# Patient Record
Sex: Female | Born: 1980 | Race: Black or African American | Hispanic: No | Marital: Single | Smoking: Never smoker
Health system: Southern US, Community
[De-identification: ages and names within clinical notes are randomized; demographics above are authoritative.]

## PROBLEM LIST (undated history)

## (undated) HISTORY — PX: ABDOMINAL HYSTERECTOMY: SHX81

---

## 2008-06-29 ENCOUNTER — Ambulatory Visit: Payer: Self-pay | Admitting: Family Medicine

## 2013-05-28 ENCOUNTER — Emergency Department: Payer: Self-pay | Admitting: Emergency Medicine

## 2013-05-28 LAB — BASIC METABOLIC PANEL
Anion Gap: 4 — ABNORMAL LOW (ref 7–16)
BUN: 8 mg/dL (ref 7–18)
Calcium, Total: 8.7 mg/dL (ref 8.5–10.1)
Chloride: 107 mmol/L (ref 98–107)
Co2: 29 mmol/L (ref 21–32)
Creatinine: 0.8 mg/dL (ref 0.60–1.30)
EGFR (African American): >60
EGFR (Non-African Amer.): >60
Glucose: 106 mg/dL — ABNORMAL HIGH (ref 65–99)
Osmolality: 278 (ref 275–301)
Potassium: 3.6 mmol/L (ref 3.5–5.1)
Sodium: 140 mmol/L (ref 136–145)

## 2013-05-28 LAB — CBC
HCT: 34.5 % — ABNORMAL LOW (ref 35.0–47.0)
HGB: 12 g/dL (ref 12.0–16.0)
MCH: 29.8 pg (ref 26.0–34.0)
Platelet: 268 10*3/uL (ref 150–440)
RDW: 13.2 % (ref 11.5–14.5)
WBC: 6.5 10*3/uL (ref 3.6–11.0)

## 2013-12-09 ENCOUNTER — Emergency Department: Payer: Self-pay | Admitting: Emergency Medicine

## 2014-09-17 IMAGING — CR RIGHT ANKLE - COMPLETE 3+ VIEW
1 series · 3 of 3 positions shown · non-contrast
Comparison: None.

CLINICAL DATA: Right ankle pain.  Fell off step.  Swelling.

EXAM:
RIGHT ANKLE - COMPLETE 3+ VIEW

[Series 1: x ankle ap right · 0.14mm/px · 3 of 3 slices shown]
[im 1/3]
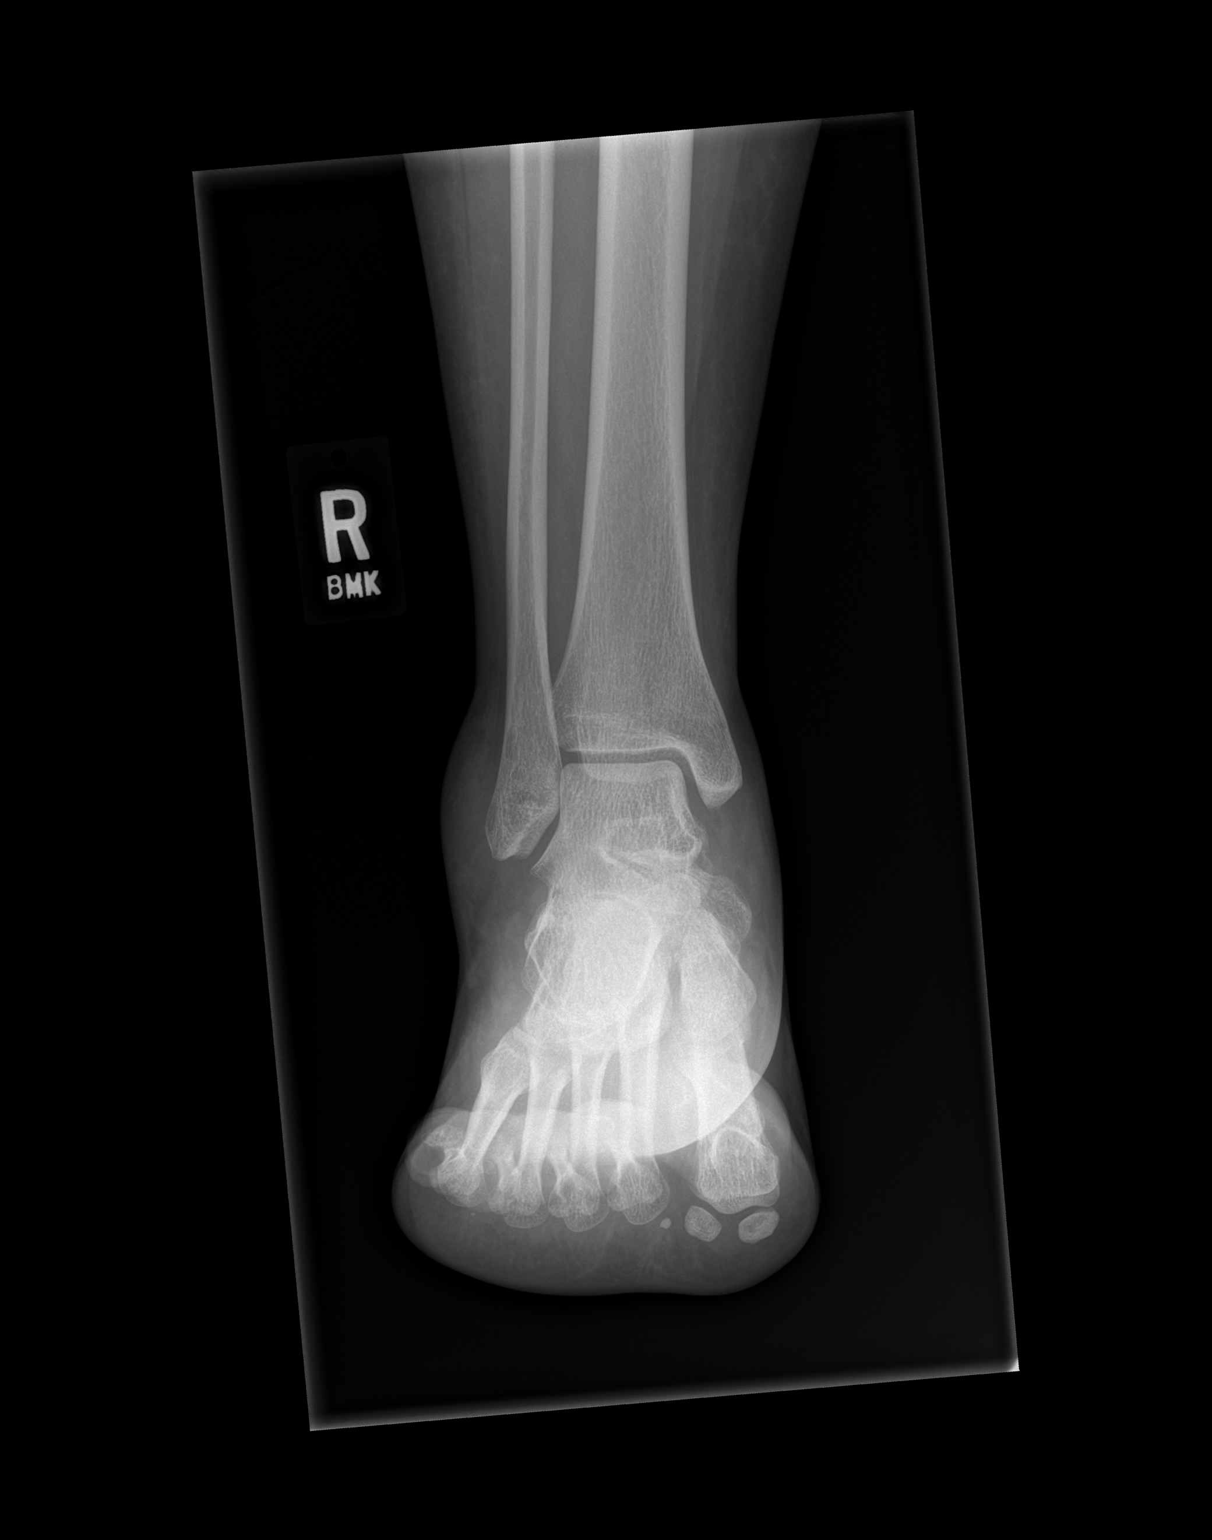
[im 2/3]
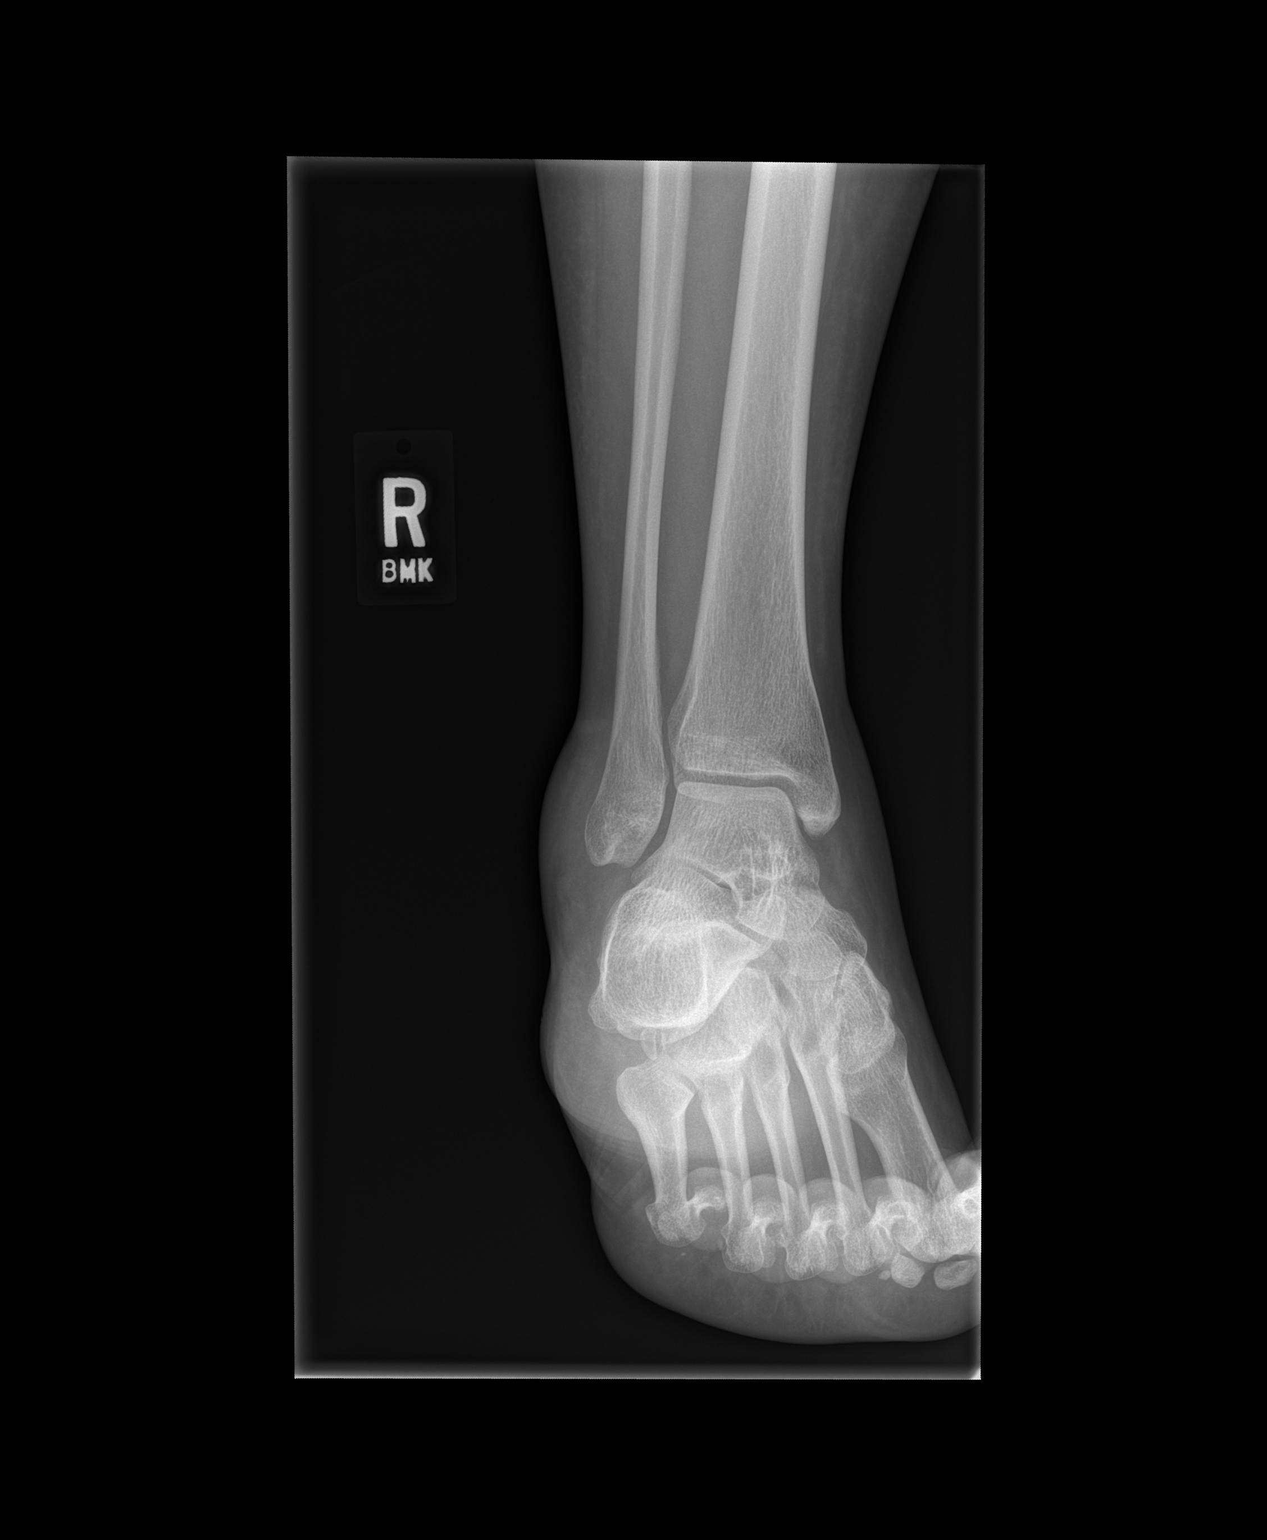
[im 3/3]
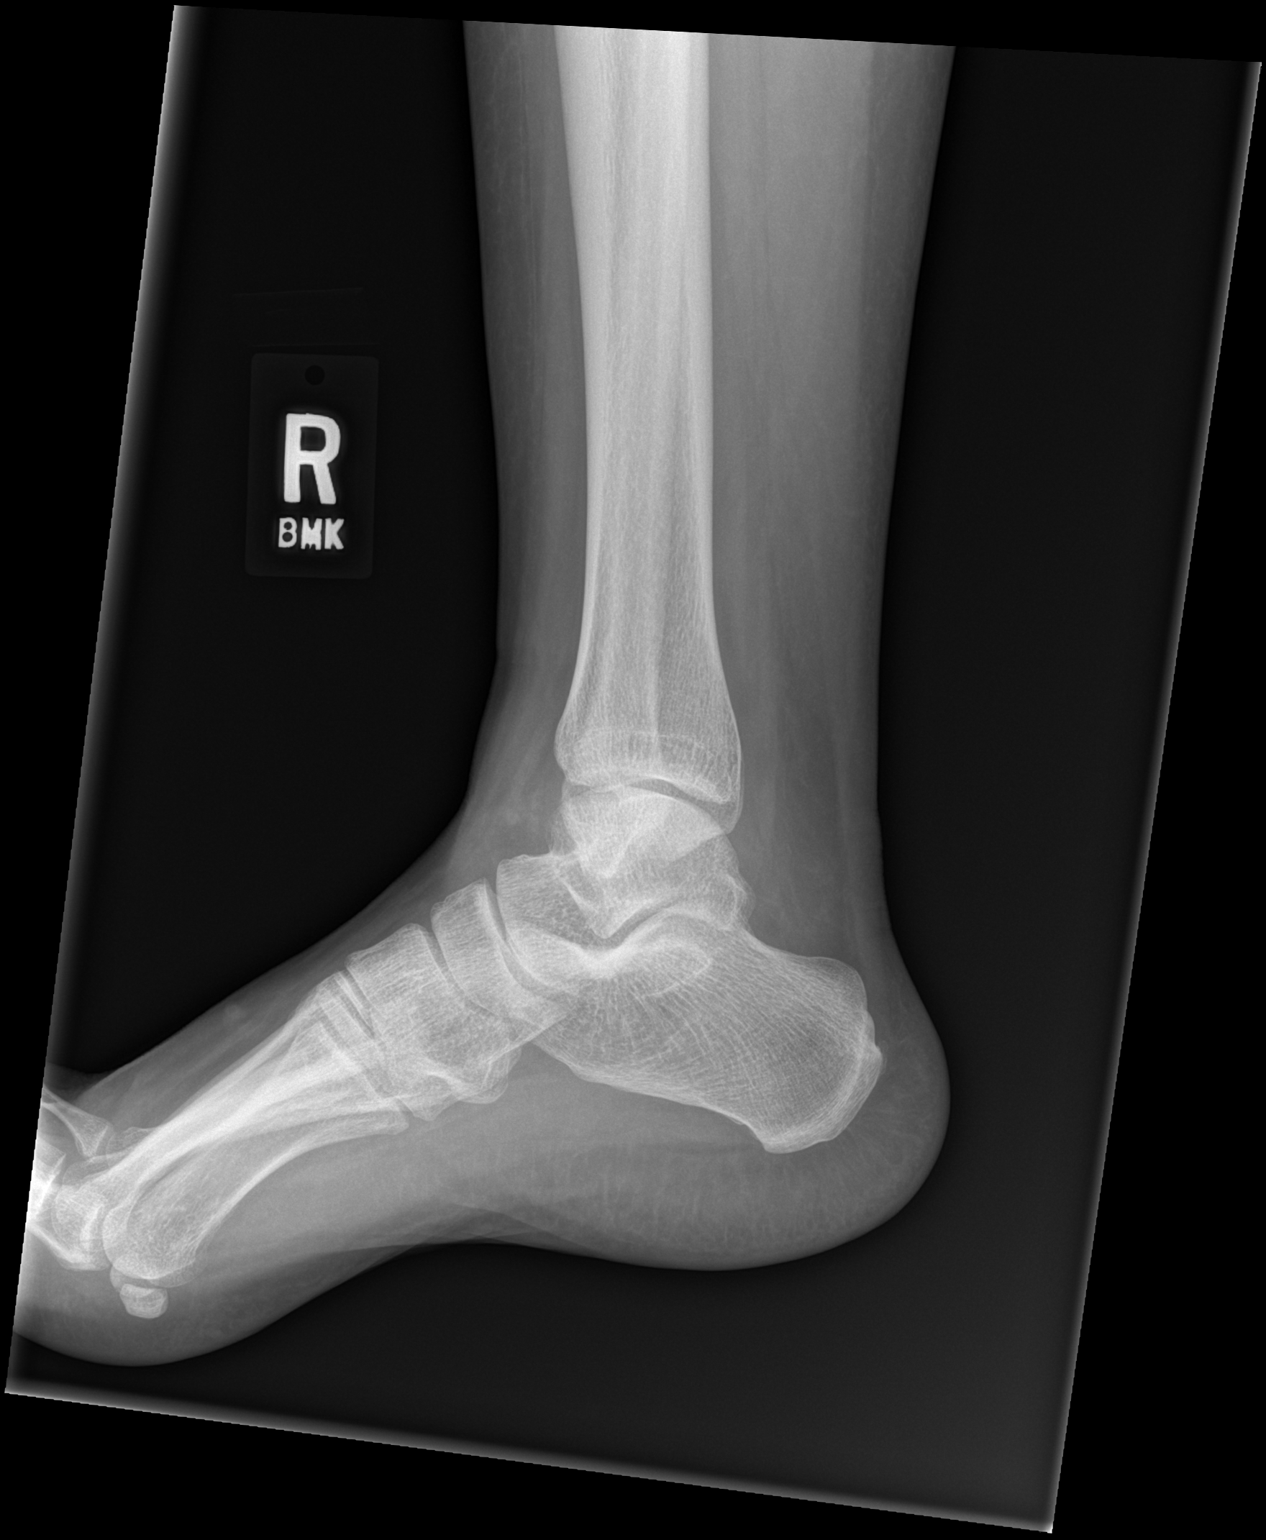

[3 of 3 positions shown; findings below may reference images not displayed]

FINDINGS: There is significant lateral soft tissue swelling. No evidence for
acute fracture or subluxation. No radiopaque foreign body or soft
tissue gas.
IMPRESSION: Significant lateral soft tissue swelling.

## 2023-01-01 ENCOUNTER — Inpatient Hospital Stay (HOSPITAL_COMMUNITY): Payer: BLUE CROSS/BLUE SHIELD

## 2023-01-01 ENCOUNTER — Encounter: Payer: Self-pay | Admitting: Emergency Medicine

## 2023-01-01 ENCOUNTER — Inpatient Hospital Stay (HOSPITAL_COMMUNITY)
Admission: EM | Admit: 2023-01-01 | Discharge: 2023-01-03 | DRG: 064 | Disposition: A | Discharge status: Home / Self Care | Payer: BLUE CROSS/BLUE SHIELD | Source: Other Acute Inpatient Hospital | Attending: Neurology | Admitting: Neurology

## 2023-01-01 ENCOUNTER — Emergency Department: Payer: BLUE CROSS/BLUE SHIELD

## 2023-01-01 ENCOUNTER — Encounter (HOSPITAL_COMMUNITY): Payer: Self-pay

## 2023-01-01 ENCOUNTER — Other Ambulatory Visit: Payer: Self-pay

## 2023-01-01 ENCOUNTER — Emergency Department
Admission: EM | Admit: 2023-01-01 | Discharge: 2023-01-01 | Disposition: A | Discharge status: Other Acute Inpatient Hospital | Payer: BLUE CROSS/BLUE SHIELD | Attending: Emergency Medicine | Admitting: Emergency Medicine

## 2023-01-01 DIAGNOSIS — R569 Unspecified convulsions: Secondary | ICD-10-CM

## 2023-01-01 DIAGNOSIS — I611 Nontraumatic intracerebral hemorrhage in hemisphere, cortical: Principal | ICD-10-CM | POA: Diagnosis present

## 2023-01-01 DIAGNOSIS — G936 Cerebral edema: Secondary | ICD-10-CM | POA: Diagnosis present

## 2023-01-01 DIAGNOSIS — I619 Nontraumatic intracerebral hemorrhage, unspecified: Secondary | ICD-10-CM | POA: Diagnosis present

## 2023-01-01 DIAGNOSIS — I6389 Other cerebral infarction: Secondary | ICD-10-CM | POA: Diagnosis not present

## 2023-01-01 DIAGNOSIS — R519 Headache, unspecified: Secondary | ICD-10-CM | POA: Diagnosis present

## 2023-01-01 DIAGNOSIS — I10 Essential (primary) hypertension: Secondary | ICD-10-CM | POA: Diagnosis present

## 2023-01-01 DIAGNOSIS — I161 Hypertensive emergency: Secondary | ICD-10-CM

## 2023-01-01 DIAGNOSIS — E785 Hyperlipidemia, unspecified: Secondary | ICD-10-CM | POA: Diagnosis present

## 2023-01-01 DIAGNOSIS — G935 Compression of brain: Secondary | ICD-10-CM | POA: Diagnosis present

## 2023-01-01 LAB — CBC WITH DIFFERENTIAL/PLATELET
Abs Immature Granulocytes: 0.07 10*3/uL (ref 0.00–0.07)
Basophils Absolute: 0 10*3/uL (ref 0.0–0.1)
Basophils Relative: 0 %
Eosinophils Absolute: 0 10*3/uL (ref 0.0–0.5)
Eosinophils Relative: 0 %
HCT: 39.4 % (ref 36.0–46.0)
Hemoglobin: 13.1 g/dL (ref 12.0–15.0)
Immature Granulocytes: 1 %
Lymphocytes Relative: 7 %
Lymphs Abs: 0.9 10*3/uL (ref 0.7–4.0)
MCH: 27.8 pg (ref 26.0–34.0)
MCHC: 33.2 g/dL (ref 30.0–36.0)
MCV: 83.5 fL (ref 80.0–100.0)
Monocytes Absolute: 0.5 10*3/uL (ref 0.1–1.0)
Monocytes Relative: 4 %
Neutro Abs: 10.7 10*3/uL — ABNORMAL HIGH (ref 1.7–7.7)
Neutrophils Relative %: 88 %
Platelets: 381 10*3/uL (ref 150–400)
RBC: 4.72 MIL/uL (ref 3.87–5.11)
RDW: 13.1 % (ref 11.5–15.5)
WBC: 12.1 10*3/uL — ABNORMAL HIGH (ref 4.0–10.5)
nRBC: 0 % (ref 0.0–0.2)

## 2023-01-01 LAB — URINE DRUG SCREEN, QUALITATIVE (ARMC ONLY)
Amphetamines, Ur Screen: NOT DETECTED
Barbiturates, Ur Screen: NOT DETECTED
Benzodiazepine, Ur Scrn: NOT DETECTED
Cannabinoid 50 Ng, Ur ~~LOC~~: NOT DETECTED
Cocaine Metabolite,Ur ~~LOC~~: NOT DETECTED
MDMA (Ecstasy)Ur Screen: NOT DETECTED
Methadone Scn, Ur: NOT DETECTED
Opiate, Ur Screen: NOT DETECTED
Phencyclidine (PCP) Ur S: NOT DETECTED
Tricyclic, Ur Screen: NOT DETECTED

## 2023-01-01 LAB — PROTIME-INR
INR: 1 (ref 0.8–1.2)
Prothrombin Time: 12.8 seconds (ref 11.4–15.2)

## 2023-01-01 LAB — POC URINE PREG, ED: Preg Test, Ur: NEGATIVE

## 2023-01-01 LAB — URINALYSIS, ROUTINE W REFLEX MICROSCOPIC
Bilirubin Urine: NEGATIVE
Glucose, UA: 50 mg/dL — AB
Hgb urine dipstick: NEGATIVE
Ketones, ur: NEGATIVE mg/dL
Leukocytes,Ua: NEGATIVE
Nitrite: NEGATIVE
Protein, ur: 100 mg/dL — AB
Specific Gravity, Urine: 1.015 (ref 1.005–1.030)
pH: 6 (ref 5.0–8.0)

## 2023-01-01 LAB — COMPREHENSIVE METABOLIC PANEL WITH GFR
ALT: 23 U/L (ref 0–44)
AST: 28 U/L (ref 15–41)
Albumin: 4.1 g/dL (ref 3.5–5.0)
Alkaline Phosphatase: 106 U/L (ref 38–126)
Anion gap: 9 (ref 5–15)
BUN: 10 mg/dL (ref 6–20)
CO2: 23 mmol/L (ref 22–32)
Calcium: 8.7 mg/dL — ABNORMAL LOW (ref 8.9–10.3)
Chloride: 103 mmol/L (ref 98–111)
Creatinine, Ser: 0.8 mg/dL (ref 0.44–1.00)
GFR, Estimated: >60 mL/min
Glucose, Bld: 140 mg/dL — ABNORMAL HIGH (ref 70–99)
Potassium: 3.2 mmol/L — ABNORMAL LOW (ref 3.5–5.1)
Sodium: 135 mmol/L (ref 135–145)
Total Bilirubin: 0.7 mg/dL (ref 0.3–1.2)
Total Protein: 8 g/dL (ref 6.5–8.1)

## 2023-01-01 LAB — APTT: aPTT: 28 s (ref 24–36)

## 2023-01-01 MED ORDER — ACETAMINOPHEN 650 MG RE SUPP: 650.0000 mg | RECTAL | Status: DC | PRN

## 2023-01-01 MED ORDER — CLEVIDIPINE BUTYRATE 0.5 MG/ML IV EMUL
0.0000 mg/h | INTRAVENOUS | Status: DC
  Administered 2023-01-01: 9 mg/h via INTRAVENOUS
  Filled 2023-01-01: qty 100

## 2023-01-01 MED ORDER — IOHEXOL 350 MG/ML SOLN
75.0000 mL | Freq: Once | INTRAVENOUS | Status: AC | PRN
  Administered 2023-01-01: 75 mL via INTRAVENOUS

## 2023-01-01 MED ORDER — LABETALOL HCL 5 MG/ML IV SOLN: 20.0000 mg | Freq: Once | INTRAVENOUS | Status: DC

## 2023-01-01 MED ORDER — SENNOSIDES-DOCUSATE SODIUM 8.6-50 MG PO TABS
1.0000 | ORAL_TABLET | Freq: Two times a day (BID) | ORAL | Status: DC
  Administered 2023-01-02: 1 via ORAL
  Filled 2023-01-01 (×2): qty 1

## 2023-01-01 MED ORDER — MAGIC MOUTHWASH W/LIDOCAINE
5.0000 mL | Freq: Four times a day (QID) | ORAL | Status: DC | PRN
  Administered 2023-01-02 – 2023-01-03 (×3): 5 mL via ORAL
  Filled 2023-01-01 (×5): qty 5

## 2023-01-01 MED ORDER — PANTOPRAZOLE SODIUM 40 MG IV SOLR
40.0000 mg | Freq: Every day | INTRAVENOUS | Status: DC
  Administered 2023-01-01: 40 mg via INTRAVENOUS
  Filled 2023-01-01: qty 10

## 2023-01-01 MED ORDER — CLEVIDIPINE BUTYRATE 0.5 MG/ML IV EMUL
0.0000 mg/h | INTRAVENOUS | Status: DC
  Administered 2023-01-01: 2 mg/h via INTRAVENOUS
  Filled 2023-01-01 (×2): qty 50

## 2023-01-01 MED ORDER — LEVETIRACETAM IN NACL 1000 MG/100ML IV SOLN
1000.0000 mg | Freq: Once | INTRAVENOUS | Status: AC
  Administered 2023-01-01: 1000 mg via INTRAVENOUS
  Filled 2023-01-01: qty 100

## 2023-01-01 MED ORDER — ACETAMINOPHEN 325 MG PO TABS
650.0000 mg | ORAL_TABLET | ORAL | Status: DC | PRN
  Administered 2023-01-02 – 2023-01-03 (×2): 650 mg via ORAL
  Filled 2023-01-01 (×2): qty 2

## 2023-01-01 MED ORDER — LEVETIRACETAM IN NACL 1000 MG/100ML IV SOLN
1000.0000 mg | Freq: Once | INTRAVENOUS | Status: AC
  Administered 2023-01-01: 1000 mg via INTRAVENOUS
  Filled 2023-01-01 (×2): qty 100

## 2023-01-01 MED ORDER — STROKE: EARLY STAGES OF RECOVERY BOOK
Freq: Once | Status: AC
  Administered 2023-01-02: 1
  Filled 2023-01-01: qty 1

## 2023-01-01 MED ORDER — LEVETIRACETAM 500 MG PO TABS
500.0000 mg | ORAL_TABLET | Freq: Two times a day (BID) | ORAL | Status: DC
  Administered 2023-01-01 – 2023-01-03 (×4): 500 mg via ORAL
  Filled 2023-01-01 (×4): qty 1

## 2023-01-01 MED ORDER — PHENOL 1.4 % MT LIQD: 1.0000 | OROMUCOSAL | Status: DC | PRN

## 2023-01-01 MED ORDER — ACETAMINOPHEN 160 MG/5ML PO SOLN: 650.0000 mg | ORAL | Status: DC | PRN

## 2023-01-01 NOTE — ED Notes (Signed)
EMTALA reviewed by this RN, transfer consent signed.  

## 2023-01-01 NOTE — Progress Notes (Signed)
Neurology telephone note  I was contacted by Dr. Starleen Blue [facility] ED by phone about this 41 yo patient who presented after first-time GTC seizure and was found to have R frontoparietal hyperdensity on head CT concerning for hemorrhage. She is not on anticoagulation. She is hypertensive to the 725D systolic. She is currently lethargic but waking up and is oriented x3 without focal deficits. There are no beds currently available on 4N.  - ED to ED transfer to [facility]. Patient will be admitted from there to 4N by Dr. Annice Pih - Clevidipine for goal SBP <150 - SCDs for DVT prophylaxis - Head CT in 6 hrs assess stability of ICH - CTA head now r/o vascular malformation - MRI brain wwo for further characterization - S/p keppra 2000mg  load, f/b 500mg  bid - HOB elevated 30 degrees  D/w Dr. Lorrin Goodell and Dr. Starleen Blue. Please page me for any questions prior to 8pm, and page Sal Khal[facility] overnight neurohospitalist for any questions after 8pm.  Su Monks, MD [facility] [phone]  If 7pm- 7am, please page neurology on call as listed in [place].

## 2023-01-01 NOTE — ED Notes (Signed)
Pt to ED via [place] from work for seizure. Per EMS when they arrived pt was post-ictal. Pt has trauma to tongue and was incontinent of urine. EMS states that pt does not have hx/o seizures. EMS reports that pts initial HR was 130 but is now 92 and sinus. Pt CBG 208, no hx/o DM.

## 2023-01-01 NOTE — ED Provider Notes (Signed)
[facility] Provider Note    Event Date/Time   First MD Initiated Contact with Patient 01/01/23 1811     (approximate)   History   No chief complaint on file.   HPI  Kimberly Osborn is a 42 y.o. female with no significant past medical history who presents after a seizure.  Patient is accompanied by family who were not there for the event but were told that she was at work standing up when she suddenly stopped working and had what looked like seizure activity.  Unclear if she fell to the ground or not.  Patient does not remember the episode.  She did urinate on herself.  Also bit her tongue.  Patient has been having some headaches.  Denies vision change numbness tingling or weakness.  Denies drug or alcohol use.  No prior history of hypertension.     No past medical history on file.  There are no problems to display for this patient.    Physical Exam  Triage Vital Signs: ED Triage Vitals  Enc Vitals Group     BP 01/01/23 1743 (!) 155/107     Pulse Rate 01/01/23 1743 (!) 113     Resp 01/01/23 1743 16     Temp 01/01/23 1743 98.4 F (36.9 C)     Temp Source 01/01/23 1743 Oral     SpO2 01/01/23 1819 100 %     Weight 01/01/23 1739 170 lb (77.1 kg)     Height 01/01/23 1739 5\' 3"  (1.6 m)     Head Circumference --      Peak Flow --      Pain Score 01/01/23 1739 0     Pain Loc --      Pain Edu? --      Excl. in [place]? --     Most recent vital signs: Vitals:   01/01/23 1920 01/01/23 2001  BP: (!) 143/77 (!) 154/90  Pulse: (!) 120 (!) 125  Resp: 14 19  Temp:  98.4 F (36.9 C)  SpO2: 98% 98%     General: Awake, no distress.  CV:  Good peripheral perfusion.  Resp:  Normal effort.  Abd:  No distention.  Neuro:             Awake, Alert, Oriented x 3 patient is somewhat slow to respond but is not dysarthric or aphasic Other:  Aox3, nml speech  PERRL, EOMI, face symmetric, nml tongue movement  5/5 strength in the BL upper and lower extremities   Sensation grossly intact in the BL upper and lower extremities  Finger-nose-finger intact BL  Small abrasion on the right chin Lateral tongue laceration   ED Results / Procedures / Treatments  Labs (all labs ordered are listed, but only abnormal results are displayed) Labs Reviewed  URINALYSIS, ROUTINE W REFLEX MICROSCOPIC - Abnormal; Notable for the following components:      Result Value   Color, Urine YELLOW (*)    APPearance HAZY (*)    Glucose, UA 50 (*)    Protein, ur 100 (*)    Bacteria, UA RARE (*)    All other components within normal limits  CBC WITH DIFFERENTIAL/PLATELET - Abnormal; Notable for the following components:   WBC 12.1 (*)    Neutro Abs 10.7 (*)    All other components within normal limits  COMPREHENSIVE METABOLIC PANEL - Abnormal; Notable for the following components:   Potassium 3.2 (*)    Glucose, Bld 140 (*)  Calcium 8.7 (*)    All other components within normal limits  PROTIME-INR  APTT  URINE DRUG SCREEN, QUALITATIVE ([facility] ONLY)  POC URINE PREG, ED     EKG     RADIOLOGY I reviewed interpreted the CT Noncon scan which shows intraparenchymal hemorrhage   PROCEDURES:  Critical Care performed: Yes, see critical care procedure note(s)  Procedures  The patient is on the cardiac monitor to evaluate for evidence of arrhythmia and/or significant heart rate changes.   MEDICATIONS ORDERED IN ED: Medications  clevidipine (CLEVIPREX) infusion 0.5 mg/mL (16 mg/hr Intravenous Rate/Dose Change 01/01/23 1857)  levETIRAcetam (KEPPRA) IVPB 1000 mg/100 mL premix (has no administration in time range)  levETIRAcetam (KEPPRA) IVPB 1000 mg/100 mL premix (0 mg Intravenous Stopped 01/01/23 1911)  iohexol (OMNIPAQUE) 350 MG/ML injection 75 mL (75 mLs Intravenous Contrast Given 01/01/23 1948)     IMPRESSION / MDM / ASSESSMENT AND PLAN / ED COURSE  I reviewed the triage vital signs and the nursing notes.                              Patient's  presentation is most consistent with acute presentation with potential threat to life or bodily function.  Differential diagnosis includes, but is not limited to, subarachnoid hemorrhage, intraparenchymal hemorrhage, new onset epilepsy, hypertensive emergency  Patient is a 42 year old female with no prior medical history presents after a seizure.  This was witnessed at work but somewhat unclear exactly what happened.  Her family members at bedside said that he was told that she was standing up when she started to look abnormal and then had seizure-like activity.  Patient did urinate on herself and bite her tongue.  She is not sure if she hit her head does have a small abrasion on the chin.  On exam patient is somewhat slow to respond but otherwise her neurologic exam is nonfocal.  CT head obtained from triage shows intraparenchymal hemorrhage in the frontoparietal region on the right with some associated mass effect but no midline shift.  Discussed with Dr. Cari Caraway and Dr. Quinn Axe with neurology.  I have started clevidipine drip with blood pressure goal less than 150 as well as load with Keppra.  Dr. Quinn Axe with neurology has spoken to [facility] neurology and she will be transferred.  Patient is agreeable to transfer.  Will obtain a CTA prior to transport.       FINAL CLINICAL IMPRESSION(S) / ED DIAGNOSES   Final diagnoses:  Intraparenchymal hemorrhage of brain ([place])     Rx / DC Orders   ED Discharge Orders     None        Note:  This document was prepared using Dragon voice recognition software and may include unintentional dictation errors.   Rada Hay, MD 01/01/23 2011

## 2023-01-01 NOTE — ED Notes (Signed)
Called [facility] transfer Ander Purpura) for ED to ED transfer per Starleen Blue, MD

## 2023-01-01 NOTE — ED Triage Notes (Signed)
Pt arrives as a transfer from [facility] with c/o seizure like activity. Pt does not have hx of seizures. Pt a&ox4.

## 2023-01-01 NOTE — ED Triage Notes (Signed)
Reprots a seizure at work today.  Denies previous history of seizure.  Currently AAOx3.  Skin warm and dry. NAD.States "My legs feel a little funny"

## 2023-01-01 NOTE — H&P (Signed)
NEUROLOGY CONSULTATION NOTE   Date of service: January 01, 2023 Patient Name: Kimberly Osborn MRN:  [id number] DOB:  [birth year]  _ _ _   _ __   _ __ _ _  __ __   _ __   __ _  History of Present Illness  Kimberly Osborn is a 42 y.o. female with no significant medical hx but also has not been to a doctor in several years who presents with seizure at work.  Patient reports that she works as a Conservation officer, nature and was working on some paperwork at her job when she was noted by coworkers and customers to have a seizure.  She has no recollection of the actual event.  She urinated on herself and bit her tongue.  No prior history of seizures.  Does not drink alcohol.  No recent head trauma.  No family history of epilepsy/seizures.  Recently took some marijuana but denies any other substance use.  She was brought in to [facility] emergency department where she had imaging with CT head which was notable for a right frontal parietal hemorrhage.   LKW: 1600 mRS: 0 tNKASE: Not offered, she has ICH. Thrombectomy: Not offered, she has ICH. ICH score: 0. NIHSS components Score: Comment  1a Level of Conscious 0[x]  1[]  2[]  3[]      1b LOC Questions 0[x]  1[]  2[]       1c LOC Commands 0[x]  1[]  2[]       2 Best Gaze 0[x]  1[]  2[]       3 Visual 0[x]  1[]  2[]  3[]      4 Facial Palsy 0[x]  1[]  2[]  3[]      5a Motor Arm - left 0[x]  1[]  2[]  3[]  4[]  UN[]    5b Motor Arm - Right 0[x]  1[]  2[]  3[]  4[]  UN[]    6a Motor Leg - Left 0[x]  1[]  2[]  3[]  4[]  UN[]    6b Motor Leg - Right 0[x]  1[]  2[]  3[]  4[]  UN[]    7 Limb Ataxia 0[x]  1[]  2[]  3[]  UN[]     8 Sensory 0[x]  1[]  2[]  UN[]      9 Best Language 0[x]  1[]  2[]  3[]      10 Dysarthria 0[x]  1[]  2[]  UN[]      11 Extinct. and Inattention 0[x]  1[]  2[]       TOTAL: 0       ROS   Constitutional Denies weight loss, fever and chills.   HEENT Denies changes in vision and hearing.   Respiratory Denies SOB and cough.   CV Denies palpitations and CP   GI Denies abdominal  pain, nausea, vomiting and diarrhea.   GU Denies dysuria and urinary frequency.   MSK Denies myalgia and joint pain.   Skin Denies rash and pruritus.   Neurological + headache but no syncope.   Psychiatric Denies recent changes in mood. Denies anxiety and depression.    Past History  History reviewed. No pertinent past medical history. Past Surgical History:  Procedure Laterality Date   ABDOMINAL HYSTERECTOMY     History reviewed. No pertinent family history. Social History   Socioeconomic History   Marital status: Single    Spouse name: Not on file   Number of children: Not on file   Years of education: Not on file   Highest education level: Not on file  Occupational History   Not on file  Tobacco Use   Smoking status: Never   Smokeless tobacco: Never  Substance and Sexual Activity   Alcohol use: Yes    Comment: occasional   Drug use:  Yes    Types: Marijuana    Comment: occasional   Sexual activity: Not on file  Other Topics Concern   Not on file  Social History Narrative   Not on file   Social Determinants of Health   Financial Resource Strain: Not on file  Food Insecurity: Not on file  Transportation Needs: Not on file  Physical Activity: Not on file  Stress: Not on file  Social Connections: Not on file   No Known Allergies  Medications  (Not in a hospital admission)    Vitals   Vitals:   01/01/23 2108  Weight: 77.1 kg     Body mass index is 30.11 kg/m.  Physical Exam   General: Laying comfortably in bed; in no acute distress.  HENT: Normal oropharynx and mucosa. Left lateral tongue bite with some dried blood. Normal external appearance of ears and nose.  Neck: Supple, no pain or tenderness  CV: No JVD. No peripheral edema.  Pulmonary: Symmetric Chest rise. Normal respiratory effort.  Abdomen: Soft to touch, non-tender.  Ext: No cyanosis, edema, or deformity  Skin: No rash. Normal palpation of skin.   Musculoskeletal: Normal digits and  nails by inspection. No clubbing.   Neurologic Examination  Mental status/Cognition: Alert, oriented to self, place, month and year, good attention.  Speech/language: Fluent, comprehension intact, object naming intact, repetition intact.  Cranial nerves:   CN II Pupils equal and reactive to light, no VF deficits    CN III,IV,VI EOM intact, no gaze preference or deviation, no nystagmus    CN V normal sensation in V1, V2, and V3 segments bilaterally    CN VII no asymmetry, no nasolabial fold flattening    CN VIII normal hearing to speech    CN IX & X normal palatal elevation, no uvular deviation    CN XI 5/5 head turn and 5/5 shoulder shrug bilaterally    CN XII midline tongue protrusion    Motor:  Muscle bulk: normal, tone normal, pronator drift none tremor none Mvmt Root Nerve  Muscle Right Left Comments  SA C5/6 Ax Deltoid 5 5   EF C5/6 Mc Biceps 5 5   EE C6/7/8 Rad Triceps 5 5   WF C6/7 Med FCR     WE C7/8 PIN ECU     F Ab C8/T1 U ADM/FDI 5 5   HF L1/2/3 Fem Illopsoas 5 5   KE L2/3/4 Fem Quad 5 5   DF L4/5 D Peron Tib Ant 5 5   PF S1/2 Tibial Grc/Sol 5 5    Sensation:  Light touch Intact throughout   Pin prick    Temperature    Vibration   Proprioception    Coordination/Complex Motor:  - Finger to Nose intact bilaterally - Heel to shin intact bilaterally - Rapid alternating movement are normal - Gait: Deferred for patient's safety. Labs   CBC:  Recent Labs  Lab 01/01/23 1754  WBC 12.1*  NEUTROABS 10.7*  HGB 13.1  HCT 39.4  MCV 83.5  PLT 784    Basic Metabolic Panel:  Lab Results  Component Value Date   NA 135 01/01/2023   K 3.2 (L) 01/01/2023   CO2 23 01/01/2023   GLUCOSE 140 (H) 01/01/2023   BUN 10 01/01/2023   CREATININE 0.80 01/01/2023   CALCIUM 8.7 (L) 01/01/2023   GFRNONAA >60 01/01/2023   GFRAA >60 05/28/2013   Lipid Panel: No results found for: "LDLCALC" HgbA1c: No results found for: "HGBA1C" Urine Drug Screen: No  results found for:  "LABOPIA", "COCAINSCRNUR", "LABBENZ", "AMPHETMU", "THCU", "LABBARB"  Alcohol Level No results found for: "ETH"  CT Head without contrast(Personally reviewed): Small volume right frontal parietal hemorrhage.  CT angio Head and Neck with contrast(Personally reviewed): No LVO, no aneurysm, no AVM.  MRI Brain(Personally reviewed): pending  rEEG:  pending  Impression   Kimberly Osborn is a 42 y.o. female with PMH significant for with no significant medical hx but also has not been to a doctor in several years who presents with seizure at work.  Found to have small volume right frontoparietal hemorrhage with an ICH score of 0.  Her neurologic examination is notable for no focal deficit.  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  Secondary Diagnosis: Hypertension Emergency (SBP > 180 or DBP > 120 & end organ damage)  Recommendations   Right frontoparietal hemorrhage with an ICH score of 0: - Admit to ICU - Stability scan in 6 hours or STAT with any neurological decline - Frequent neuro checks; q89min for 1 hour, then q1hour - No antiplatelets or anticoagulants due to [place] - SCD for DVT prophylaxis, pharmacological DVT ppx at 24 hours if ICH is stable - Blood pressure control with goal systolic 025 - 427, cleverplex and labetalol PRN - Stroke labs, HgbA1c, fasting lipid panel - MRI brain with and without contrast when stabilized to evaluate for underlying mass - Risk factor modification - Echocardiogram - PT consult, OT consult, Speech consult. - Stroke team to follow   First-time generalized tonic-clonic seizure likely due to Oxly: - Keppra 500mg  BID - routine EEG - no driving for 6 months. Has to be seizure free before she can resume driving.  Hypertensive emergency: - goal SBP as above. Will use Cleviprex gtt. to keep blood pressure within  goal.  ______________________________________________________________________  This patient is critically ill and at significant risk of neurological worsening, death and care requires constant monitoring of vital signs, hemodynamics,respiratory and cardiac monitoring, neurological assessment, discussion with family, other specialists and medical decision making of high complexity. I spent 35 minutes of neurocritical care time  in the care of  this patient. This was time spent independent of any time provided by nurse practitioner or PA.  Donnetta Simpers [facility] Pager Number [phone] 01/01/2023  11:03 PM  Thank you for the opportunity to take part in the care of this patient. If you have any further questions, please contact the neurology consultation attending.  Signed,  [place] Pager Number [phone] _ _ _   _ __   _ __ _ _  __ __   _ __   __ _

## 2023-01-01 NOTE — ED Provider Triage Note (Signed)
Emergency Medicine Provider Triage Evaluation Note  Kimberly Osborn, a 42 y.o. female  was evaluated in triage.  Pt complains of witnessed seizure activity.  She presents to the ED via EMS from her workplace.  She apparently had a witnessed seizure activity and a moment of syncope.  Patient experienced bladder/bowel incontinence.  Presents to the ED for evaluation, denies any history of seizures.  Review of Systems  Positive: Seizure activity Negative: FCS  Physical Exam  There were no vitals taken for this visit. Gen:   Awake, no distress  Resp:  Normal effort CTA MSK:   Moves extremities without difficulty  Other:    Medical Decision Making  Medically screening exam initiated at 5:17 PM.  Appropriate orders placed.  Kimberly Osborn was informed that the remainder of the evaluation will be completed by another provider, this initial triage assessment does not replace that evaluation, and the importance of remaining in the ED until their evaluation is complete.  Patient to the ED for evaluation of witnessed seizure activity.  She presents from her workplace via EMS but denies any history of this pain.   Kimberly Needles, PA-C 01/01/23 1719

## 2023-01-01 NOTE — ED Notes (Signed)
Placed Pure wick on PT 

## 2023-01-01 NOTE — ED Notes (Signed)
This RN at bedside, pt resting in stretcher on room air.  Family is at bedside. She is alert and oriented.

## 2023-01-02 ENCOUNTER — Inpatient Hospital Stay (HOSPITAL_COMMUNITY): Payer: BLUE CROSS/BLUE SHIELD

## 2023-01-02 DIAGNOSIS — I6389 Other cerebral infarction: Secondary | ICD-10-CM

## 2023-01-02 LAB — ECHOCARDIOGRAM COMPLETE
Area-P 1/2: 3.61 cm2
Height: 63 in
MV M vel: 0.99 m/s
MV Peak grad: 3.9 mmHg
S' Lateral: 2.8 cm
Weight: 2720 oz

## 2023-01-02 LAB — MRSA NEXT GEN BY PCR, NASAL: MRSA by PCR Next Gen: NOT DETECTED

## 2023-01-02 LAB — LIPID PANEL
Cholesterol: 132 mg/dL (ref 0–200)
HDL: 42 mg/dL (ref >40)
LDL Cholesterol: 80 mg/dL (ref 0–99)
Total CHOL/HDL Ratio: 3.1 RATIO
Triglycerides: 52 mg/dL (ref <150)
VLDL: 10 mg/dL (ref 0–40)

## 2023-01-02 LAB — HIV ANTIBODY (ROUTINE TESTING W REFLEX): HIV Screen 4th Generation wRfx: NONREACTIVE

## 2023-01-02 MED ORDER — LABETALOL HCL 5 MG/ML IV SOLN: 20.0000 mg | INTRAVENOUS | Status: DC | PRN

## 2023-01-02 MED ORDER — ORAL CARE MOUTH RINSE: 15.0000 mL | OROMUCOSAL | Status: DC | PRN

## 2023-01-02 MED ORDER — IOHEXOL 350 MG/ML SOLN
75.0000 mL | Freq: Once | INTRAVENOUS | Status: AC | PRN
  Administered 2023-01-02: 75 mL via INTRAVENOUS

## 2023-01-02 MED ORDER — CHLORHEXIDINE GLUCONATE CLOTH 2 % EX PADS
6.0000 | MEDICATED_PAD | Freq: Every day | CUTANEOUS | Status: DC
  Administered 2023-01-02 (×2): 6 via TOPICAL

## 2023-01-02 MED ORDER — GADOBUTROL 1 MMOL/ML IV SOLN
9.0000 mL | Freq: Once | INTRAVENOUS | Status: AC | PRN
  Administered 2023-01-02: 9 mL via INTRAVENOUS

## 2023-01-02 MED ORDER — HYDRALAZINE HCL 20 MG/ML IJ SOLN
20.0000 mg | Freq: Four times a day (QID) | INTRAMUSCULAR | Status: DC | PRN
  Administered 2023-01-03: 20 mg via INTRAVENOUS
  Filled 2023-01-02: qty 1

## 2023-01-02 MED ORDER — PANTOPRAZOLE SODIUM 40 MG PO TBEC
40.0000 mg | DELAYED_RELEASE_TABLET | Freq: Every day | ORAL | Status: DC
  Administered 2023-01-02: 40 mg via ORAL
  Filled 2023-01-02: qty 1

## 2023-01-02 MED ORDER — POTASSIUM CHLORIDE CRYS ER 20 MEQ PO TBCR: 40.0000 meq | EXTENDED_RELEASE_TABLET | Freq: Two times a day (BID) | ORAL | Status: DC

## 2023-01-02 MED ORDER — POTASSIUM CHLORIDE CRYS ER 20 MEQ PO TBCR
40.0000 meq | EXTENDED_RELEASE_TABLET | Freq: Once | ORAL | Status: AC
  Administered 2023-01-02: 40 meq via ORAL
  Filled 2023-01-02: qty 2

## 2023-01-02 NOTE — Progress Notes (Signed)
  Transition of Care [facility]) Screening Note   Patient Details  Name: Kimberly Osborn Date of Birth: [birth year]   Transition of Care [facility]) CM/SW Contact:    Ella Bodo, RN Phone Number: 01/02/2023, 5:26 PM    Transition of Care Department [facility]) has reviewed patient and no TOC needs have been identified at this time. We will continue to monitor patient advancement through interdisciplinary progression rounds. If new patient transition needs arise, please place a TOC consult.  Reinaldo Raddle, RN, BSN  Trauma/Neuro ICU Case Manager [phone]

## 2023-01-02 NOTE — Progress Notes (Addendum)
STROKE TEAM PROGRESS NOTE   INTERVAL HISTORY Patient is seen in her room with her boyfriend at the bedside.  Yesterday, she presented after having a seizure at work in which she bit her tongue and was incontinent of urine.  She has never had a seizure before.  She was found to have a right frontoparietal ICH on head CT. Blood pressure adequately controlled without any medications.  She denies any prior history of hypertension, drug or alcohol abuse No recent history of weight loss or any malignancy. No family history of brain aneurysms or AVMs or strokes at a young age She is not on any blood thinners, birth control pills.  She does take ibuprofen 3-4 times a week for headaches but not more than 8 to 10 tablets/week. Vitals:   01/02/23 1000 01/02/23 1100 01/02/23 1148 01/02/23 1200  BP: (!) 136/127 (!) 155/93  (!) 157/107  Pulse: 97 81  96  Resp: 12 14  15   Temp:   98.4 F (36.9 C)   TempSrc:   Oral   SpO2: 97% 99%  95%  Weight:       CBC:  Recent Labs  Lab 01/01/23 1754  WBC 12.1*  NEUTROABS 10.7*  HGB 13.1  HCT 39.4  MCV 83.5  PLT 381   Basic Metabolic Panel:  Recent Labs  Lab 01/01/23 1754  NA 135  K 3.2*  CL 103  CO2 23  GLUCOSE 140*  BUN 10  CREATININE 0.80  CALCIUM 8.7*   Lipid Panel:  Recent Labs  Lab 01/02/23 0329  CHOL 132  TRIG 52  HDL 42  CHOLHDL 3.1  VLDL 10  LDLCALC 80   HgbA1c: No results for input(s): "HGBA1C" in the last 168 hours. Urine Drug Screen:  Recent Labs  Lab 01/01/23 1704  LABOPIA NONE DETECTED  COCAINSCRNUR NONE DETECTED  LABBENZ NONE DETECTED  AMPHETMU NONE DETECTED  THCU NONE DETECTED  LABBARB NONE DETECTED    Alcohol Level No results for input(s): "ETH" in the last 168 hours.  IMAGING past 24 hours EEG adult  Result Date: 01/02/2023 03/03/2023, MD     01/02/2023  8:53 AM Patient Name: Kimberly Osborn MRN: Orvil Feil Epilepsy Attending: [id number] Referring Physician/Provider: Charlsie Quest, MD Date:  01/02/2023 Duration: 25.09 mins Patient history: 42 y.o. female who presents with seizure at work.  EEG to evaluate for seizure. Level of alertness: Awake, asleep AEDs during EEG study: LEV Technical aspects: This EEG study was done with scalp electrodes positioned according to the 10-20 International system of electrode placement. Electrical activity was reviewed with band pass filter of 1-70Hz , sensitivity of 7 uV/mm, display speed of 20mm/sec with a 60Hz  notched filter applied as appropriate. EEG data were recorded continuously and digitally stored.  Video monitoring was available and reviewed as appropriate. Description: The posterior dominant rhythm consists of 9 Hz activity of moderate voltage (25-35 uV) seen predominantly in posterior head regions, symmetric and reactive to eye opening and eye closing. Sleep was characterized by vertex waves, sleep spindles (12 to 14 Hz), maximal frontocentral region.  EEG showed continuous 3 to 6 Hz theta- delta slowing in right temporal region. Sharp waves also noted in right temporal region which appeared quasiperiodic at 1 Hz. Hyperventilation and photic stimulation were not performed.   ABNORMALITY - Sharp wave, right temporal region - Continuous slow, right temporal region IMPRESSION: This study showed evidence of epileptogenicity and cortical dysfunction arising from right temporal region likely secondary underlying structural abnormality. No seizures  were seen throughout the recording. Lora Havens   MR BRAIN W WO CONTRAST  Result Date: 01/02/2023 CLINICAL DATA:  Seizure.  Hemorrhagic stroke. EXAM: MRI HEAD WITHOUT AND WITH CONTRAST TECHNIQUE: Multiplanar, multiecho pulse sequences of the brain and surrounding structures were obtained without and with intravenous contrast. CONTRAST:  33mL GADAVIST GADOBUTROL 1 MMOL/ML IV SOLN COMPARISON:  Head CT 01/01/2023 FINDINGS: Brain: Unchanged appearance of intraparenchymal hematoma at the right frontoparietal junction. No  abnormal diffusion restriction. There are magnetic susceptibility effects from the hemorrhage obscuring general region. There is mild surrounding edema. No midline shift or other mass effect. No hydrocephalus. Vascular: Normal flow voids. Skull and upper cervical spine: Normal marrow signal. Sinuses/Orbits: Negative. Other: None. IMPRESSION: Unchanged appearance of intraparenchymal hematoma at the right frontoparietal junction with mild surrounding edema. There is no visible acute ischemia. Electronically Signed   By: Ulyses Jarred M.D.   On: 01/02/2023 02:00   CT HEAD WO CONTRAST (5MM)  Result Date: 01/02/2023 CLINICAL DATA:  Intracranial hemorrhage follow up EXAM: CT HEAD WITHOUT CONTRAST TECHNIQUE: Contiguous axial images were obtained from the base of the skull through the vertex without intravenous contrast. RADIATION DOSE REDUCTION: This exam was performed according to the departmental dose-optimization program which includes automated exposure control, adjustment of the mA and/or kV according to patient size and/or use of iterative reconstruction technique. COMPARISON:  01/01/2023 FINDINGS: Brain: Unchanged appearance of intraparenchymal hematoma at the right frontoparietal junction. Mild surrounding edema is unchanged. No midline shift or other mass effect. Brain parenchyma is otherwise normal. Vascular: No hyperdense vessel or unexpected calcification. Skull: Normal. Negative for fracture or focal lesion. Sinuses/Orbits: No acute finding. Other: None. IMPRESSION: Unchanged appearance of intraparenchymal hematoma at the right frontoparietal junction. Electronically Signed   By: Ulyses Jarred M.D.   On: 01/02/2023 00:19   CT ANGIO HEAD W OR WO CONTRAST  Result Date: 01/01/2023 CLINICAL DATA:  Acute neurologic deficit.  Intracranial hemorrhage. EXAM: CT ANGIOGRAPHY HEAD TECHNIQUE: Multidetector CT imaging of the head was performed using the standard protocol during bolus administration of intravenous  contrast. Multiplanar CT image reconstructions and MIPs were obtained to evaluate the vascular anatomy. RADIATION DOSE REDUCTION: This exam was performed according to the departmental dose-optimization program which includes automated exposure control, adjustment of the mA and/or kV according to patient size and/or use of iterative reconstruction technique. CONTRAST:  73mL OMNIPAQUE IOHEXOL 350 MG/ML SOLN COMPARISON:  Head CT 01/01/2023 FINDINGS: POSTERIOR CIRCULATION: --Vertebral arteries: Normal --Inferior cerebellar arteries: Normal. --Basilar artery: Normal. --Superior cerebellar arteries: Normal. --Posterior cerebral arteries: Normal. ANTERIOR CIRCULATION: --Intracranial internal carotid arteries: Normal. --Anterior cerebral arteries (ACA): Normal. --Middle cerebral arteries (MCA): Normal. No vascular malformation. Venous sinuses: As permitted by contrast timing, patent. Anatomic variants: None Unchanged hemorrhage compared to earlier head CT. Review of the MIP images confirms the above findings. IMPRESSION: No vascular malformation, aneurysm or intracranial occlusion. Electronically Signed   By: Ulyses Jarred M.D.   On: 01/01/2023 19:58   CT Head Wo Contrast  Result Date: 01/01/2023 CLINICAL DATA:  Seizure EXAM: CT HEAD WITHOUT CONTRAST TECHNIQUE: Contiguous axial images were obtained from the base of the skull through the vertex without intravenous contrast. RADIATION DOSE REDUCTION: This exam was performed according to the departmental dose-optimization program which includes automated exposure control, adjustment of the mA and/or kV according to patient size and/or use of iterative reconstruction technique. COMPARISON:  05/28/2013 CT head FINDINGS: Brain: Hyperdense area in the right frontoparietal region, likely hemorrhage, which measures 1.3 x 1.4 x  2.2 cm (AP x TR x CC) (series 5, image 40 and series 4, image 26). Surrounding hypodensity, likely edema. Mild local mass effect with sulcal effacement,  without significant ventricular effacement or midline shift. No hydrocephalus or extra-axial collection. Vascular: No hyperdense vessel. Skull: Normal. Negative for fracture or focal lesion. Sinuses/Orbits: No acute finding. Other: The mastoid air cells are well aerated. IMPRESSION: Hyperdense hemorrhage in the right frontoparietal region, with surrounding edema and mild local mass effect. No midline shift. MRI with and without contrast is recommended for further evaluation. These results were called by telephone at the time of interpretation on 01/01/2023 at 6:22 pm to provider [place] , who verbally acknowledged these results. Electronically Signed   By: Merilyn Baba M.D.   On: 01/01/2023 18:24    PHYSICAL EXAM General: Alert, well-nourished, well-developed pleasant middle-aged African-American lady in no acute distress Respiratory: Regular, unlabored respirations on room air  NEURO:  Mental Status: AA&Ox3  Speech/Language: speech is without dysarthria or aphasia.  Fluency, and comprehension intact.  Cranial Nerves:  II: PERRL. Visual fields full.  III, IV, VI: EOMI. Eyelids elevate symmetrically.  V: Sensation is intact to light touch and symmetrical to face.  VII: Smile is symmetrical.  VIII: hearing intact to voice. IX, X: Phonation is normal.  BW:[id number] shrug 5/5. XII: tongue is midline without fasciculations. Motor: 5/5 strength to all muscle groups tested.  Tone: is normal and bulk is normal Sensation- Intact to light touch bilaterally.  Coordination: FTN intact bilaterally.No drift.  Gait- deferred   ASSESSMENT/PLAN Ms. Gaynel Brauer is a 42 y.o. female with no significant past medical history presenting after having a seizure at work in which she bit her tongue and was incontinent of urine.  She has never had a seizure before.  She was found to have a right frontoparietal ICH on head CT.  She has not had any recent illnesses or injuries, but does have a family history of  cancer.  Will obtain CT chest abdomen and pelvis to rule out occult malignancy, and will obtain CT venogram to rule out venous sinus thrombosis.  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 VTE prophylaxis -SCDs    Diet   Diet Heart Room service appropriate? Yes; Fluid consistency: Thin   No antithrombotic prior to admission, now on No antithrombotic secondary to [place] Therapy recommendations: Pending Disposition: Pending  Hypertension Home meds: None Stable without Cleviprex for now Systolic blood pressure goal 130-150 for 24 hours then less than 160 Long-term BP goal normotensive  Hyperlipidemia Home meds: None LDL 80, goal < 70 Add statin at discharge  Risk for diabetes type II  Home meds:  none HgbA1c No results found for requested labs within last 1095 days., goal < 7.0 CBGs No results for input(s): "GLUCAP" in the last 72 hours.  SSI  Other Stroke Risk Factors Headaches with frequent ibuprofen use  Other Active Problems None  Hospital day # [place] , MSN, AGACNP-BC [facility] See Amion for schedule and pager information 01/02/2023 1:26 PM   STROKE MD NOTE :  I have personally obtained history,examined this patient, reviewed notes, independently viewed  imaging studies, participated in medical decision making and plan of care.ROS completed by me personally and pertinent positives fully documented  I have made any additions or clarifications directly to the above note. Agree with note above.  Patient presented with new onset seizure at work symptomatic from new right  frontoparietal parenchymal hemorrhage with cytotoxic edema.  MRI does not show any underlying structural vascular lesion and CT angiogram was also unremarkable.  Exact etiology of this hemorrhage is undetermined at this time.  Recommend close neurological monitoring and strict blood pressure control with systolic goal 130-150 for the first 24 hours and then below 160.  Mobilize out of bed.  Therapy consults.  Check CT scan of the chest abdomen and pelvis to rule out malignancies and CT venogram to rule out cortical vein thrombosis. Long discussion with patient and wife at the bedside and answered questions.This patient is critically ill and at significant risk of neurological worsening, death and care requires constant monitoring of vital signs, hemodynamics,respiratory and cardiac monitoring, extensive review of multiple databases, frequent neurological assessment, discussion with family, other specialists and medical decision making of high complexity.I have made any additions or clarifications directly to the above note.This critical care time does not reflect procedure time, or teaching time or supervisory time of PA/NP/Med Resident etc but could involve care discussion time.  I spent 30 minutes of neurocritical care time  in the care of  this patient.     Delia Heady, MD Medical Director [facility] Pager: [phone] 01/02/2023 3:02 PM   To contact Stroke Continuity provider, please refer to [email or web address]. After hours, contact General Neurology

## 2023-01-02 NOTE — Evaluation (Signed)
Physical Therapy Evaluation Patient Details Name: Kimberly Osborn MRN: [id number] DOB: 1981/04/14 Today's Date: 01/02/2023  History of Present Illness  Pt is a 42 y/o female presenting as transfer from [facility] for seizure at work.  CT/MRI showed intraparenchymal hematoma at the R frontoparietal junction.  EEG showed epileptogenicity in the temporal region.  No pertinent PMHx.  Clinical Impression  Pt admitted with/for seizure with abnormal EEG.  Pt not quite at baseline, needing min guard to supervision overall.  Expect steady improvement to baseline functioning..  Pt currently limited functionally due to the problems listed below.  (see problems list.)  Pt will benefit from PT to maximize function and safety to be able to get home safely with available assist.        Recommendations for follow up therapy are one component of a multi-disciplinary discharge planning process, led by the attending physician.  Recommendations may be updated based on patient status, additional functional criteria and insurance authorization.  Follow Up Recommendations No PT follow up (likely will need no follow up)      Assistance Recommended at Discharge Set up Supervision/Assistance  Patient can return home with the following  Assistance with cooking/housework;Assist for transportation    Equipment Recommendations None recommended by PT  Recommendations for Other Services       Functional Status Assessment Patient has had a recent decline in their functional status and demonstrates the ability to make significant improvements in function in a reasonable and predictable amount of time.     Precautions / Restrictions Precautions Precautions: Fall (low risk)      Mobility  Bed Mobility Overal bed mobility: Needs Assistance Bed Mobility: Supine to Sit     Supine to sit: Min guard          Transfers Overall transfer level: Needs assistance   Transfers: Sit to/from Stand Sit to Stand: Min  assist, Min guard           General transfer comment: First stand with LE support against bed frame and minimal assist, but once up for a bit, pt needed no stability    Ambulation/Gait Ambulation/Gait assistance: Min guard Gait Distance (Feet): 300 Feet Assistive device: None, 1 person hand held assist Gait Pattern/deviations: Step-through pattern   Gait velocity interpretation: >2.62 ft/sec, indicative of community ambulatory   General Gait Details: steady, slower, but ability to ramp up speed to age appropriate levels after up ambulating for a bit.  Pt without overt deviation with scanning or speed changes.  Stairs            Wheelchair Mobility    Modified Rankin (Stroke Patients Only) Modified Rankin (Stroke Patients Only) Pre-Morbid Rankin Score: No symptoms Modified Rankin: Moderate disability     Balance Overall balance assessment: Mild deficits observed, not formally tested                               Standardized Balance Assessment Standardized Balance Assessment : Berg Balance Test Berg Balance Test Sit to Stand: Able to stand without using hands and stabilize independently Standing Unsupported: Able to stand 2 minutes with supervision Sitting with Back Unsupported but Feet Supported on Floor or Stool: Able to sit safely and securely 2 minutes Stand to Sit: Sits safely with minimal use of hands Standing Unsupported with Eyes Closed: Able to stand 10 seconds with supervision From Standing Position, Pick up Object from Floor: Able to pick up shoe, needs supervision From  Standing Position, Turn to Look Behind Over each Shoulder: Looks behind from both sides and weight shifts well         Pertinent Vitals/Pain Pain Assessment Pain Assessment: Faces Faces Pain Scale: Hurts little more Pain Location: tongue  bite Pain Descriptors / Indicators: Discomfort Pain Intervention(s): Monitored during session    Home Living Family/patient  expects to be discharged to:: Private residence Living Arrangements: Children (children are 31 and 67 y/o) Available Help at Discharge: Family;Available PRN/intermittently;Available 24 hours/day Type of Home: House Home Access: Stairs to enter Entrance Stairs-Rails: Doctor, general practice of Steps: several   Home Layout: One level Home Equipment: None      Prior Function Prior Level of Function : Independent/Modified Independent;Working/employed;Driving                     Hand Dominance        Extremity/Trunk Assessment   Upper Extremity Assessment Upper Extremity Assessment: Overall WFL for tasks assessed (old L shoulder dysfunction)    Lower Extremity Assessment Lower Extremity Assessment: Overall WFL for tasks assessed (mild general weakness)    Cervical / Trunk Assessment Cervical / Trunk Assessment: Normal  Communication   Communication: No difficulties  Cognition Arousal/Alertness: Awake/alert Behavior During Therapy: WFL for tasks assessed/performed Overall Cognitive Status: Within Functional Limits for tasks assessed                                 General Comments: maybe a little slowed processing.        General Comments General comments (skin integrity, edema, etc.): HR rose to low 120's with activity, asymptomatic.    Exercises     Assessment/Plan    PT Assessment Patient needs continued PT services  PT Problem List Decreased strength       PT Treatment Interventions Gait training;Functional mobility training;Therapeutic activities;Balance training;Patient/family education;Stair training    PT Goals (Current goals can be found in the Care Plan section)  Acute Rehab PT Goals Patient Stated Goal: Eventually back to work PT Goal Formulation: With patient Time For Goal Achievement: 01/09/23 Potential to Achieve Goals: Good    Frequency Min 3X/week     Co-evaluation               AM-PAC PT "6 Clicks"  Mobility  Outcome Measure Help needed turning from your back to your side while in a flat bed without using bedrails?: A Little Help needed moving from lying on your back to sitting on the side of a flat bed without using bedrails?: A Little Help needed moving to and from a bed to a chair (including a wheelchair)?: A Little Help needed standing up from a chair using your arms (e.g., wheelchair or bedside chair)?: A Little Help needed to walk in hospital room?: A Little Help needed climbing 3-5 steps with a railing? : A Little 6 Click Score: 18    End of Session   Activity Tolerance: Patient tolerated treatment well Patient left: in chair;with call bell/phone within reach;with chair alarm set Nurse Communication: Mobility status PT Visit Diagnosis: Other abnormalities of gait and mobility (R26.89);Other symptoms and signs involving the nervous system (R29.898)    Time: 3329-5188 PT Time Calculation (min) (ACUTE ONLY): 20 min   Charges:   PT Evaluation $PT Eval Low Complexity: 1 Low          01/02/2023  Jacinto Halim., PT Acute Rehabilitation Services [phone]  (office)  Kimberly Osborn 01/02/2023, 1:43 PM

## 2023-01-02 NOTE — Procedures (Signed)
Patient Name: Arzella Rehmann  MRN: [id number]  Epilepsy Attending: Lora Havens  Referring Physician/Provider: Donnetta Simpers, MD  Date: 01/02/2023  Duration: 25.09 mins  Patient history: 42 y.o. female who presents with seizure at work.  EEG to evaluate for seizure.  Level of alertness: Awake, asleep  AEDs during EEG study: LEV  Technical aspects: This EEG study was done with scalp electrodes positioned according to the 10-20 International system of electrode placement. Electrical activity was reviewed with band pass filter of 1-70Hz , sensitivity of 7 uV/mm, display speed of 23mm/sec with a 60Hz  notched filter applied as appropriate. EEG data were recorded continuously and digitally stored.  Video monitoring was available and reviewed as appropriate.  Description: The posterior dominant rhythm consists of 9 Hz activity of moderate voltage (25-35 uV) seen predominantly in posterior head regions, symmetric and reactive to eye opening and eye closing. Sleep was characterized by vertex waves, sleep spindles (12 to 14 Hz), maximal frontocentral region.  EEG showed continuous 3 to 6 Hz theta- delta slowing in right temporal region. Sharp waves also noted in right temporal region which appeared quasiperiodic at 1 Hz. Hyperventilation and photic stimulation were not performed.     ABNORMALITY - Sharp wave, right temporal region - Continuous slow, right temporal region  IMPRESSION: This study showed evidence of epileptogenicity and cortical dysfunction arising from right temporal region likely secondary underlying structural abnormality. No seizures were seen throughout the recording.  Eleuterio Dollar Barbra Sarks

## 2023-01-02 NOTE — ED Provider Notes (Signed)
Patient was transferred to [facility] ER for neurology evaluation.  Neurology team has already seen the patient and ordered MRI and CT scan.  They are clinically stable.   Varney Biles, MD 01/02/23 1530

## 2023-01-02 NOTE — ED Notes (Signed)
Pt transported to MRI with RN.

## 2023-01-02 NOTE — Progress Notes (Signed)
  Echocardiogram 2D Echocardiogram has been performed.  Wynelle Link 01/02/2023, 3:52 PM

## 2023-01-02 NOTE — ED Notes (Signed)
Patient transported to MRI 

## 2023-01-02 NOTE — Progress Notes (Signed)
EEG complete - results pending 

## 2023-01-02 NOTE — TOC CAGE-AID Note (Signed)
Transition of Care [facility] - CAGE-AID Screening   Patient Details  Name: Kimberly Osborn MRN: [id number] Date of Birth: Apr 30, 1981  Transition of Care [facility]) CM/SW Contact:    Benard Halsted, LCSW Phone Number: 01/02/2023, 5:14 PM   Clinical Narrative: Patient denies drug or alcohol use so resources not indicated.    CAGE-AID Screening:    Have You Ever Felt You Ought to Cut Down on Your Drinking or Drug Use?: No Have People Annoyed You By Critizing Your Drinking Or Drug Use?: No Have You Felt Bad Or Guilty About Your Drinking Or Drug Use?: No Have You Ever Had a Drink or Used Drugs First Thing In The Morning to Steady Your Nerves or to Get Rid of a Hangover?: No CAGE-AID Score: 0  Substance Abuse Education Offered: No

## 2023-01-02 NOTE — Progress Notes (Signed)
OT Cancellation Note  Patient Details Name: Kimberly Osborn MRN: [id number] DOB: 12-05-1981   Cancelled Treatment:    Reason Eval/Treat Not Completed: Active bedrest order. Will assess when activity orders updated.   Rinnah Peppel,HILLARY 01/02/2023, 7:18 AM Maurie Boettcher, OT/L   Acute OT Clinical Specialist Acute Rehabilitation Services Pager [phone] Office [phone]

## 2023-01-03 DIAGNOSIS — R569 Unspecified convulsions: Secondary | ICD-10-CM

## 2023-01-03 LAB — BASIC METABOLIC PANEL
Anion gap: 8 (ref 5–15)
BUN: 7 mg/dL (ref 6–20)
CO2: 22 mmol/L (ref 22–32)
Calcium: 8.4 mg/dL — ABNORMAL LOW (ref 8.9–10.3)
Chloride: 99 mmol/L (ref 98–111)
Creatinine, Ser: 0.85 mg/dL (ref 0.44–1.00)
GFR, Estimated: >60 mL/min (ref >60)
Glucose, Bld: 101 mg/dL — ABNORMAL HIGH (ref 70–99)
Potassium: 3.3 mmol/L — ABNORMAL LOW (ref 3.5–5.1)
Sodium: 129 mmol/L — ABNORMAL LOW (ref 135–145)

## 2023-01-03 LAB — HEMOGLOBIN A1C
Hgb A1c MFr Bld: 6 % — ABNORMAL HIGH (ref 4.8–5.6)
Mean Plasma Glucose: 126 mg/dL

## 2023-01-03 LAB — SEDIMENTATION RATE: Sed Rate: 23 mm/hr — ABNORMAL HIGH (ref 0–22)

## 2023-01-03 MED ORDER — LEVETIRACETAM 500 MG PO TABS: 500.0000 mg | ORAL_TABLET | Freq: Two times a day (BID) | ORAL | 1 refills | Status: AC

## 2023-01-03 NOTE — Evaluation (Signed)
Speech Language Pathology Evaluation Patient Details Name: Kimberly Osborn MRN: [id number] DOB: [birth year] Today's Date: 01/03/2023 Time: 8675-4492 SLP Time Calculation (min) (ACUTE ONLY): 24 min  Problem List:  Patient Active Problem List   Diagnosis Date Noted   ICH (intracerebral hemorrhage) ([place]) 01/01/2023   Past Medical History: History reviewed. No pertinent past medical history. Past Surgical History:  Past Surgical History:  Procedure Laterality Date   ABDOMINAL HYSTERECTOMY     HPI:  Pt is a 42 y/o female presenting as transfer from [facility] for seizure at work.  CT/MRI showed intraparenchymal hematoma at the R frontoparietal junction.  EEG showed epileptogenicity in the temporal region.  No pertinent PMHx.   Assessment / Plan / Recommendation Clinical Impression  Pt presents with suspected acute cognitive changes, including reduced delayed recall, selective attention, organization of thought, complex problem solving, and ability to self-correct. She scored 17/30 on the SLUMS, suggestive of cognitive impairment. Pt agrees that these changes are likely acute in nature but believes that she has support upon discharge home for higher level cognitive tasks. Education reinforced about safety and precautions. OP SLP recommended upon discharge.    SLP Assessment  SLP Recommendation/Assessment: Patient needs continued [facility] SLP Visit Diagnosis: Cognitive communication deficit (R41.841)    Recommendations for follow up therapy are one component of a multi-disciplinary discharge planning process, led by the attending physician.  Recommendations may be updated based on patient status, additional functional criteria and insurance authorization.    Follow Up Recommendations  Outpatient SLP    Assistance Recommended at Discharge  Intermittent Supervision/Assistance  Functional Status Assessment Patient has had a recent decline in their functional status and  demonstrates the ability to make significant improvements in function in a reasonable and predictable amount of time.  Frequency and Duration min 2x/week  2 weeks      SLP Evaluation Cognition  Overall Cognitive Status: Impaired/Different from baseline Arousal/Alertness: Awake/alert Orientation Level: Oriented X4 Attention: Selective Selective Attention: Impaired Selective Attention Impairment: Verbal complex Memory: Impaired Memory Impairment: Retrieval deficit;Decreased recall of new information Awareness: Impaired Awareness Impairment: Anticipatory impairment Problem Solving: Impaired Problem Solving Impairment: Verbal complex Executive Function: Organizing;Self Correcting Organizing: Impaired Organizing Impairment: Verbal complex Self Correcting: Impaired Self Correcting Impairment: Verbal complex Safety/Judgment: Appears intact       Comprehension  Auditory Comprehension Overall Auditory Comprehension: Appears within functional limits for tasks assessed    Expression Expression Primary Mode of Expression: Verbal Verbal Expression Overall Verbal Expression: Appears within functional limits for tasks assessed   Oral / Motor  Motor Speech Overall Motor Speech: Appears within functional limits for tasks assessed            Osie Bond., M.A. [place] Office [phone]  Secure chat preferred  01/03/2023, 11:22 AM

## 2023-01-03 NOTE — Discharge Summary (Addendum)
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   levETIRAcetam 500 MG tablet Commonly known as: KEPPRA Take 1 tablet (500 mg total) by mouth 2 (two) times daily.   MULTIVITAMIN ADULT PO Take 1 tablet by mouth daily.        LABORATORY STUDIES CBC    Component Value Date/Time   WBC 12.1 (H) 01/01/2023 1754   RBC 4.72 01/01/2023 1754   HGB 13.1 01/01/2023 1754   HCT 39.4 01/01/2023 1754   PLT 381 01/01/2023 1754   MCV 83.5 01/01/2023 1754   MCH 27.8 01/01/2023 1754   MCHC 33.2 01/01/2023 1754   RDW 13.1 01/01/2023 1754   LYMPHSABS 0.9 01/01/2023 1754   MONOABS 0.5 01/01/2023 1754   EOSABS 0.0 01/01/2023 1754   BASOSABS 0.0 01/01/2023 1754   CMP    Component Value Date/Time   NA 129 (L) 01/03/2023 0606   K 3.3 (L) 01/03/2023 0606   CL 99 01/03/2023 0606   CO2 22 01/03/2023 0606   GLUCOSE 101 (H) 01/03/2023 0606   GLUCOSE 106 (H) 05/28/2013 0529   BUN 7 01/03/2023 0606   CREATININE 0.85 01/03/2023 0606   CALCIUM 8.4 (L) 01/03/2023 0606   PROT 8.0 01/01/2023 1754   ALBUMIN 4.1 01/01/2023 1754   AST 28 01/01/2023 1754   ALT 23 01/01/2023 1754   ALKPHOS 106 01/01/2023 1754   BILITOT 0.7 01/01/2023 1754   GFRNONAA >60 01/03/2023 0606   COAGS Lab Results  Component  Value Date   INR 1.0 01/01/2023   Lipid Panel    Component Value Date/Time   CHOL 132 01/02/2023 0329   TRIG 52 01/02/2023 0329   HDL 42 01/02/2023 0329   CHOLHDL 3.1 01/02/2023 0329   VLDL 10 01/02/2023 0329   LDLCALC 80 01/02/2023 0329   HgbA1C  Lab Results  Component Value Date   HGBA1C 6.0 (H) 01/02/2023   Urinalysis    Component Value Date/Time   COLORURINE YELLOW (A) 01/01/2023 1704   APPEARANCEUR HAZY (A) 01/01/2023 1704   LABSPEC 1.015 01/01/2023 1704   PHURINE 6.0 01/01/2023 1704   GLUCOSEU 50 (A) 01/01/2023 1704   HGBUR NEGATIVE 01/01/2023 1704   BILIRUBINUR NEGATIVE 01/01/2023 1704   KETONESUR NEGATIVE 01/01/2023 1704   PROTEINUR 100 (A) 01/01/2023 1704   NITRITE NEGATIVE 01/01/2023 1704   LEUKOCYTESUR NEGATIVE 01/01/2023 1704   Urine Drug Screen     Component Value Date/Time   LABOPIA NONE DETECTED 01/01/2023 1704   COCAINSCRNUR NONE DETECTED 01/01/2023 1704   LABBENZ NONE DETECTED 01/01/2023 1704   AMPHETMU NONE DETECTED 01/01/2023 1704   THCU NONE DETECTED 01/01/2023 1704   LABBARB NONE DETECTED 01/01/2023 1704    Alcohol Level No results found for: "ETH"   SIGNIFICANT DIAGNOSTIC STUDIES ECHOCARDIOGRAM COMPLETE  Result Date: 01/02/2023    ECHOCARDIOGRAM REPORT   Patient Name:   Kimberly Osborn Date of Exam: 01/02/2023 Medical Rec #:  [id number]         Height:       63.0 in Accession #:    [id number]        Weight:       170.0 lb Date of Birth:  [birth year]          BSA:          1.805 m Patient Age:    42 years          BP:           113/66 mmHg Patient Gender: F                 HR:           80 bpm. Exam Location:  Inpatient Procedure: 2D Echo, 3D Echo, Color Doppler and Cardiac Doppler Indications:    Stroke I63.9  History:        Patient has no prior history of Echocardiogram examinations.                 ICH; Risk Factors:Non-Smoker.  Sonographer:    Greer Pickerel Referring Phys: [id number] [facility]  Sonographer Comments: Image acquisition  challenging due to respiratory motion and Image acquisition challenging due to patient body habitus. IMPRESSIONS  1. Left ventricular ejection fraction, by estimation, is 60 to 65%. The left ventricle has normal function. The left ventricle has no regional wall motion abnormalities. There is mild left ventricular hypertrophy. Left ventricular diastolic parameters were normal.  2. Right ventricular systolic function is normal. The right ventricular size is normal. Tricuspid regurgitation signal is inadequate for assessing PA pressure.  3. The mitral valve is normal in structure. No evidence of mitral valve regurgitation. No evidence of mitral stenosis.  4. The aortic valve is tricuspid. Aortic valve regurgitation is not visualized. No aortic stenosis is present.  5. The inferior vena cava is normal in size with greater than 50% respiratory variability, suggesting right atrial pressure of 3 mmHg. FINDINGS  Left Ventricle: Left ventricular ejection fraction, by estimation, is 60 to 65%. The left ventricle has normal function. The left ventricle has no regional wall motion abnormalities. The left ventricular internal cavity size was normal in size. There is  mild left ventricular hypertrophy. Left ventricular diastolic parameters were normal. Right Ventricle: The right ventricular size is normal. No increase in right ventricular wall thickness. Right ventricular systolic function is normal. Tricuspid regurgitation signal is inadequate for assessing PA pressure. The tricuspid regurgitant velocity is 1.26 m/s, and with an assumed right atrial pressure of 3 mmHg, the estimated right ventricular systolic pressure is 9.4 mmHg. Left Atrium: Left atrial size was normal in size. Right Atrium: Right atrial size was normal in size. Pericardium: Trivial pericardial effusion is present. Mitral Valve: The mitral valve is normal in structure. No evidence of mitral valve regurgitation. No evidence of mitral valve stenosis. Tricuspid  Valve: The tricuspid valve is normal in structure. Tricuspid valve regurgitation is trivial. Aortic Valve: The aortic valve is tricuspid. Aortic valve regurgitation is not visualized. No aortic stenosis is present. Pulmonic Valve: The pulmonic valve was not well visualized. Pulmonic valve regurgitation is trivial. Aorta: The aortic root and ascending aorta are structurally normal, with no evidence of dilitation. Venous: The inferior vena cava is normal in size with greater than 50% respiratory variability, suggesting right atrial pressure of 3 mmHg.  IAS/Shunts: The interatrial septum was not well visualized.  LEFT VENTRICLE PLAX 2D LVIDd:         4.40 cm   Diastology LVIDs:         2.80 cm   LV e' medial:    7.83 cm/s LV PW:         1.00 cm   LV E/e' medial:  10.0 LV IVS:        1.00 cm   LV e' lateral:   10.80 cm/s LVOT diam:     2.00 cm   LV E/e' lateral: 7.2 LV SV:         55 LV SV Index:   30 LVOT Area:     3.14 cm                           3D Volume EF:                          3D EF:        61 %                          LV EDV:       180 ml                          LV ESV:       71 ml                          LV SV:        109 ml RIGHT VENTRICLE RV S prime:     11.10 cm/s TAPSE (M-mode): 2.1 cm LEFT ATRIUM           Index        RIGHT ATRIUM           Index LA Vol (A4C): 54.6 ml 30.26 ml/m  RA Area:     17.50 cm                                    RA Volume:   45.80 ml  25.38 ml/m  AORTIC VALVE             PULMONIC VALVE LVOT Vmax:   89.40 cm/s  PR End Diast Vel: 3.23 msec LVOT Vmean:  59.600 cm/s LVOT VTI:    0.174 m  AORTA Ao Root diam: 3.20 cm Ao Asc diam:  3.50 cm MITRAL VALVE               TRICUSPID VALVE MV Area (PHT): 3.61 cm    TR Peak grad:   6.4 mmHg MV Decel Time: 210 msec    TR Vmax:        126.00 cm/s MR Peak grad: 3.9 mmHg MR Vmax:      98.50 cm/s   SHUNTS MV E velocity: 78.00 cm/s  Systemic VTI:  0.17 m MV A velocity: 77.60 cm/s  Systemic Diam: 2.00 cm MV E/A ratio:  1.01 Oswaldo Milian MD Electronically signed by Oswaldo Milian MD Signature Date/Time: 01/02/2023/4:19:49 PM    Final    CT CHEST ABDOMEN PELVIS W CONTRAST  Result Date: 01/02/2023 CLINICAL DATA:  Metastatic disease evaluation, seizure, hemorrhagic stroke. EXAM: CT CHEST, ABDOMEN, AND  PELVIS WITH CONTRAST TECHNIQUE: Multidetector CT imaging of the chest, abdomen and pelvis was performed following the standard protocol during bolus administration of intravenous contrast. RADIATION DOSE REDUCTION: This exam was performed according to the departmental dose-optimization program which includes automated exposure control, adjustment of the mA and/or kV according to patient size and/or use of iterative reconstruction technique. CONTRAST:  42mL OMNIPAQUE IOHEXOL 350 MG/ML SOLN COMPARISON:  Report from remote abdominopelvic CT 02/07/2012, images unavailable. FINDINGS: CT CHEST FINDINGS Cardiovascular: Thoracic aorta is normal in caliber. Mild cardiomegaly. Exam not tailored for pulmonary artery assessment, allowing for this no central pulmonary embolus. No pericardial effusion. Mediastinum/Nodes: No enlarged mediastinal, hilar, or axillary lymph nodes. No thoracic inlet or supraclavicular adenopathy. There is no esophageal wall thickening, diminutive hiatal hernia. No visible thyroid nodule. Lungs/Pleura: No dominant pulmonary mass. There are multiple scattered tiny pulmonary nodules, 2-3 mm are primarily subpleural in location. Subsegmental atelectasis within the lingula and both lower lobes. No pleural fluid the trachea and central airways are patent. No features of pulmonary edema. Musculoskeletal: Mild thoracic spondylosis with anterior spurring. Mild scoliosis no destructive lytic or blastic osseous lesions. No chest wall soft tissue abnormalities or obvious breast mass by CT. CT ABDOMEN PELVIS FINDINGS Hepatobiliary: Mild diffuse decreased hepatic density typical of hepatic steatosis. No suspicious liver lesion. Layering  hyperdensity in the gallbladder may be stones/sludge or vicarious excretion of IV contrast. No gallbladder inflammation. Pancreas: No ductal dilatation or inflammation. No evidence of pancreatic mass. There is some fat interdigitating in the pancreatic tail. Spleen: Normal in size without focal abnormality. Adrenals/Urinary Tract: No adrenal nodule or mass. No renal mass. No hydronephrosis or renal calculi. Homogeneous renal enhancement. Physiologically distended urinary bladder. Increased density of bladder contents likely represents excreted IV contrast. No obvious bladder mass. Stomach/Bowel: Detailed bowel assessment is limited in the absence of enteric contrast as well as mild patient motion artifact. Small hiatal hernia. Stomach is otherwise primarily decompressed and not well assessed. No bowel obstruction or inflammatory change. Cecum is slightly low lying in the pelvis. The appendix is not definitively seen. There is no obvious small bowel or colonic mass, although majority of the left colon is decompressed which limits assessment. Vascular/Lymphatic: Mild aortic and branch atherosclerosis. No aortic aneurysm. Patent portal, splenic and mesenteric veins. No abdominopelvic adenopathy. Reproductive: Hysterectomy. No abnormality of the vaginal cuff by CT. There is an 18 mm cyst in the left ovary that is slightly increased in density. No obvious enhancing components. Right ovary is potentially but not definitively visualized. Other: No ascites. No omental stranding or thickening. No abdominal wall hernia. Musculoskeletal: No destructive lytic or blastic osseous lesions. Hemi transitional lumbosacral anatomy. Lower lumbar facet hypertrophy. No evident intramuscular lesion. IMPRESSION: 1. No evidence of metastatic disease in the chest, abdomen, or pelvis. 2. Multiple scattered tiny pulmonary nodules, 2-3 mm are primarily subpleural in location. These are likely post infectious or inflammatory. No follow-up needed  if patient is low-risk (and has no known or suspected primary neoplasm). Non-contrast chest CT can be considered in 12 months if patient is high-risk. This recommendation follows the consensus statement: Guidelines for Management of Incidental Pulmonary Nodules Detected on CT Images: From the Fleischner Society 2017; Radiology 2017; 284:228-243. 3. Mild hepatic steatosis. 4. Small hiatal hernia. 5. Left ovarian cyst measuring 18 mm, slightly increased in density. No obvious enhancing components. This is likely a hemorrhagic or proteinaceous cyst. No specific imaging follow-up is needed. 6. Mild cardiomegaly. Electronically Signed   By: Aurther Loft.D.  On: 01/02/2023 15:30   CT VENOGRAM HEAD  Result Date: 01/02/2023 CLINICAL DATA:  Hemorrhagic stroke EXAM: CT VENOGRAM HEAD TECHNIQUE: Venographic phase images of the brain were obtained following the administration of intravenous contrast. Multiplanar reformats and maximum intensity projections were generated. RADIATION DOSE REDUCTION: This exam was performed according to the departmental dose-optimization program which includes automated exposure control, adjustment of the mA and/or kV according to patient size and/or use of iterative reconstruction technique. CONTRAST:  47mL OMNIPAQUE IOHEXOL 350 MG/ML SOLN COMPARISON:  MRI Brain 01/02/23, CT Head 01/01/23 FINDINGS: Essentially nondiagnostic exam for the presence of dural venous sinus thrombosis due to poor opacification of the venous system. Redemonstrated is a parenchymal hematoma in the right frontoparietal junction with mild surrounding cerebral edema, unchanged from prior exam. The hematoma measures up to 1.6 x 1.1 cm. No new sites of hemorrhage visualized. No CT evidence of a new infarct. No hydrocephalus. No extra-axial fluid collection. IMPRESSION: 1. Nondiagnostic exam for the presence of dural venous sinus thrombosis due to poor opacification (absent opacification) of the venous system. Correlate for  the possibility of contrast infiltration. 2. Unchanged parenchymal hematoma at the right frontoparietal junction. Electronically Signed   By: Marin Roberts M.D.   On: 01/02/2023 15:24   EEG adult  Result Date: 01/02/2023 Kimberly Havens, MD     01/02/2023  8:53 AM Patient Name: Kimberly Osborn MRN: [id number] Epilepsy Attending: Lora Osborn Referring Physician/Provider: Donnetta Simpers, MD Date: 01/02/2023 Duration: 25.09 mins Patient history: 42 y.o. female who presents with seizure at work.  EEG to evaluate for seizure. Level of alertness: Awake, asleep AEDs during EEG study: LEV Technical aspects: This EEG study was done with scalp electrodes positioned according to the 10-20 International system of electrode placement. Electrical activity was reviewed with band pass filter of 1-70Hz , sensitivity of 7 uV/mm, display speed of 13mm/sec with a 60Hz  notched filter applied as appropriate. EEG data were recorded continuously and digitally stored.  Video monitoring was available and reviewed as appropriate. Description: The posterior dominant rhythm consists of 9 Hz activity of moderate voltage (25-35 uV) seen predominantly in posterior head regions, symmetric and reactive to eye opening and eye closing. Sleep was characterized by vertex waves, sleep spindles (12 to 14 Hz), maximal frontocentral region.  EEG showed continuous 3 to 6 Hz theta- delta slowing in right temporal region. Sharp waves also noted in right temporal region which appeared quasiperiodic at 1 Hz. Hyperventilation and photic stimulation were not performed.   ABNORMALITY - Sharp wave, right temporal region - Continuous slow, right temporal region IMPRESSION: This study showed evidence of epileptogenicity and cortical dysfunction arising from right temporal region likely secondary underlying structural abnormality. No seizures were seen throughout the recording. Kimberly Osborn   MR BRAIN W WO CONTRAST  Result Date: 01/02/2023 CLINICAL  DATA:  Seizure.  Hemorrhagic stroke. EXAM: MRI HEAD WITHOUT AND WITH CONTRAST TECHNIQUE: Multiplanar, multiecho pulse sequences of the brain and surrounding structures were obtained without and with intravenous contrast. CONTRAST:  69mL GADAVIST GADOBUTROL 1 MMOL/ML IV SOLN COMPARISON:  Head CT 01/01/2023 FINDINGS: Brain: Unchanged appearance of intraparenchymal hematoma at the right frontoparietal junction. No abnormal diffusion restriction. There are magnetic susceptibility effects from the hemorrhage obscuring general region. There is mild surrounding edema. No midline shift or other mass effect. No hydrocephalus. Vascular: Normal flow voids. Skull and upper cervical spine: Normal marrow signal. Sinuses/Orbits: Negative. Other: None. IMPRESSION: Unchanged appearance of intraparenchymal hematoma at the right frontoparietal junction with mild surrounding  edema. There is no visible acute ischemia. Electronically Signed   By: Ulyses Jarred M.D.   On: 01/02/2023 02:00   CT HEAD WO CONTRAST (5MM)  Result Date: 01/02/2023 CLINICAL DATA:  Intracranial hemorrhage follow up EXAM: CT HEAD WITHOUT CONTRAST TECHNIQUE: Contiguous axial images were obtained from the base of the skull through the vertex without intravenous contrast. RADIATION DOSE REDUCTION: This exam was performed according to the departmental dose-optimization program which includes automated exposure control, adjustment of the mA and/or kV according to patient size and/or use of iterative reconstruction technique. COMPARISON:  01/01/2023 FINDINGS: Brain: Unchanged appearance of intraparenchymal hematoma at the right frontoparietal junction. Mild surrounding edema is unchanged. No midline shift or other mass effect. Brain parenchyma is otherwise normal. Vascular: No hyperdense vessel or unexpected calcification. Skull: Normal. Negative for fracture or focal lesion. Sinuses/Orbits: No acute finding. Other: None. IMPRESSION: Unchanged appearance of  intraparenchymal hematoma at the right frontoparietal junction. Electronically Signed   By: Ulyses Jarred M.D.   On: 01/02/2023 00:19   CT ANGIO HEAD W OR WO CONTRAST  Result Date: 01/01/2023 CLINICAL DATA:  Acute neurologic deficit.  Intracranial hemorrhage. EXAM: CT ANGIOGRAPHY HEAD TECHNIQUE: Multidetector CT imaging of the head was performed using the standard protocol during bolus administration of intravenous contrast. Multiplanar CT image reconstructions and MIPs were obtained to evaluate the vascular anatomy. RADIATION DOSE REDUCTION: This exam was performed according to the departmental dose-optimization program which includes automated exposure control, adjustment of the mA and/or kV according to patient size and/or use of iterative reconstruction technique. CONTRAST:  46mL OMNIPAQUE IOHEXOL 350 MG/ML SOLN COMPARISON:  Head CT 01/01/2023 FINDINGS: POSTERIOR CIRCULATION: --Vertebral arteries: Normal --Inferior cerebellar arteries: Normal. --Basilar artery: Normal. --Superior cerebellar arteries: Normal. --Posterior cerebral arteries: Normal. ANTERIOR CIRCULATION: --Intracranial internal carotid arteries: Normal. --Anterior cerebral arteries (ACA): Normal. --Middle cerebral arteries (MCA): Normal. No vascular malformation. Venous sinuses: As permitted by contrast timing, patent. Anatomic variants: None Unchanged hemorrhage compared to earlier head CT. Review of the MIP images confirms the above findings. IMPRESSION: No vascular malformation, aneurysm or intracranial occlusion. Electronically Signed   By: Ulyses Jarred M.D.   On: 01/01/2023 19:58   CT Head Wo Contrast  Result Date: 01/01/2023 CLINICAL DATA:  Seizure EXAM: CT HEAD WITHOUT CONTRAST TECHNIQUE: Contiguous axial images were obtained from the base of the skull through the vertex without intravenous contrast. RADIATION DOSE REDUCTION: This exam was performed according to the departmental dose-optimization program which includes automated  exposure control, adjustment of the mA and/or kV according to patient size and/or use of iterative reconstruction technique. COMPARISON:  05/28/2013 CT head FINDINGS: Brain: Hyperdense area in the right frontoparietal region, likely hemorrhage, which measures 1.3 x 1.4 x 2.2 cm (AP x TR x CC) (series 5, image 40 and series 4, image 26). Surrounding hypodensity, likely edema. Mild local mass effect with sulcal effacement, without significant ventricular effacement or midline shift. No hydrocephalus or extra-axial collection. Vascular: No hyperdense vessel. Skull: Normal. Negative for fracture or focal lesion. Sinuses/Orbits: No acute finding. Other: The mastoid air cells are well aerated. IMPRESSION: Hyperdense hemorrhage in the right frontoparietal region, with surrounding edema and mild local mass effect. No midline shift. MRI with and without contrast is recommended for further evaluation. These results were called by telephone at the time of interpretation on 01/01/2023 at 6:22 pm to provider [place] , who verbally acknowledged these results. Electronically Signed   By: Merilyn Baba M.D.   On: 01/01/2023 18:24  HISTORY OF PRESENT ILLNESS   Kimberly Osborn is a 42 y.o. female with no past medical history on file who presents with seizure like activity while at work on 01/01/23.  She did experienced loss of bladder and bit her tongue. No history of seizures, HTN, or drug or alcohol abuse.No recent history of weight loss or any malignancy. No family history of brain aneurysms or AVMs or strokes at a young age She is not on any blood thinners, birth control pills.  She does take ibuprofen 3-4 times a week for headaches but not more than 8 to 10 tablets/week CT head demonstrated right frontoparietal ICH. No additional seizure activity overnight.  HOSPITAL COURSE MRI brain 1/4 CTV 1/4 EEG -1/4 CT chest/abdomin/pelvis  DISCHARGE EXAM Blood pressure (!) 136/98, pulse 87, temperature 98 F (36.7  C), temperature source Oral, resp. rate 17, weight 77.1 kg, SpO2 95 %. General: alert, cooperative, in no apparent distress. Mental status: alert and oriented to person, place, month, year and situation. Speech is clear and fluent.  CN II.visual fields intact to finger counting in al four quadrants CN III,IV, VI- Extraoccular motility intact. No diplopia or appreciable nystagmus. CN V. Sensation is equal bilaterally CN VII. There is no facial asymmetry CN VIII. Hearing is intact to conversation CN IX and X. Palate elevated symmetrically CN XI. Head rotation equal. CN. XII. Tongue is midline with side to side movement intact, does have a tongue bite to the left side. Motor moves all extremities equally 5/5 strength.  Sensation: intact to light touch  Discharge Diet       Diet   Diet Heart Room service appropriate? Yes; Fluid consistency: Thin   liquids  DISCHARGE PLAN Disposition:  home I. Seizures- likely r/t symptomatic hemorrhage in right frontoparietal regsion EEG evidence of epileptogenicity and cortical dysfunction arising from right temporal region likely secondary underlying structural abnormality. No seizures were seen throughout the recording  Keppra 500mg  BID started prescription sent CT head 01/01/23-Hyperdense hemorrhage in the right frontoparietal region, with surrounding edema and mild local mass effect. No midline shift. CTA- head-No vascular malformation, aneurysm or intracranial occlusion.  No driving for 6 months  II. Intraparenchymal hematoma  MRI brain- Unchanged appearance of intraparenchymal hematoma at the right frontoparietal junction with mild surrounding edema. There is no visible acute ischemia. CTA chest-No evidence of metastatic disease in the chest, abdomen, or pelvis.  Recommend f/u MRI brain w/wo  in 2 months to look for any underlying lesion Transitional care  team consulted to help patient find a PCP for 2 week follow up and establish with  pracitce. Follow-up in [facility]  in 2 months, to schedule an appointment with Dr. Leonie Man.   45 minutes were spent preparing discharge.  Mattie Marlin FNP- BC  I have personally obtained history,examined this patient, reviewed notes, independently viewed imaging studies, participated in medical decision making and plan of care.ROS completed by me personally and pertinent positives fully documented  I have made any additions or clarifications directly to the above note. Agree with note above.    Antony Contras, MD Medical Director [facility] Pager: [phone] 01/03/2023 3:20 PM

## 2023-01-08 LAB — ANA W/REFLEX IF POSITIVE: Anti Nuclear Antibody (ANA): NEGATIVE

## 2023-01-08 NOTE — Progress Notes (Signed)
Kindly inform the patient that blood test for lupus was negative.

## 2023-02-20 ENCOUNTER — Inpatient Hospital Stay: Payer: BLUE CROSS/BLUE SHIELD | Admitting: Neurology
